# Patient Record
Sex: Male | Born: 1980 | Race: Black or African American | Hispanic: No | Marital: Single | Smoking: Never smoker
Health system: Southern US, Community
[De-identification: ages and names within clinical notes are randomized; demographics above are authoritative.]

## PROBLEM LIST (undated history)

## (undated) DIAGNOSIS — N390 Urinary tract infection, site not specified: Secondary | ICD-10-CM

## (undated) DIAGNOSIS — G8220 Paraplegia, unspecified: Secondary | ICD-10-CM

## (undated) DIAGNOSIS — S24103A Unspecified injury at T7-T10 level of thoracic spinal cord, initial encounter: Secondary | ICD-10-CM

## (undated) DIAGNOSIS — J45909 Unspecified asthma, uncomplicated: Secondary | ICD-10-CM

## (undated) DIAGNOSIS — W3400XA Accidental discharge from unspecified firearms or gun, initial encounter: Secondary | ICD-10-CM

## (undated) HISTORY — PX: SKIN GRAFT: SHX250

---

## 2013-01-24 ENCOUNTER — Encounter (HOSPITAL_BASED_OUTPATIENT_CLINIC_OR_DEPARTMENT_OTHER): Payer: Self-pay

## 2013-01-24 ENCOUNTER — Emergency Department (HOSPITAL_BASED_OUTPATIENT_CLINIC_OR_DEPARTMENT_OTHER)
Admission: EM | Admit: 2013-01-24 | Discharge: 2013-01-24 | Disposition: A | Discharge status: Home / Self Care | Payer: Medicaid Other | Attending: Emergency Medicine | Admitting: Emergency Medicine

## 2013-01-24 DIAGNOSIS — N390 Urinary tract infection, site not specified: Secondary | ICD-10-CM | POA: Insufficient documentation

## 2013-01-24 DIAGNOSIS — Z87828 Personal history of other (healed) physical injury and trauma: Secondary | ICD-10-CM | POA: Insufficient documentation

## 2013-01-24 DIAGNOSIS — Z8781 Personal history of (healed) traumatic fracture: Secondary | ICD-10-CM | POA: Insufficient documentation

## 2013-01-24 DIAGNOSIS — R6883 Chills (without fever): Secondary | ICD-10-CM | POA: Insufficient documentation

## 2013-01-24 HISTORY — DX: Unspecified asthma, uncomplicated: J45.909

## 2013-01-24 HISTORY — DX: Unspecified injury at T7-t10 level of thoracic spinal cord, initial encounter: S24.103A

## 2013-01-24 LAB — URINALYSIS, ROUTINE W REFLEX MICROSCOPIC
Bilirubin Urine: NEGATIVE
Glucose, UA: NEGATIVE mg/dL
Ketones, ur: NEGATIVE mg/dL
Protein, ur: NEGATIVE mg/dL
Urobilinogen, UA: 1 mg/dL (ref 0.0–1.0)

## 2013-01-24 LAB — URINE MICROSCOPIC-ADD ON

## 2013-01-24 MED ORDER — CIPROFLOXACIN HCL 500 MG PO TABS: 500.0000 mg | ORAL_TABLET | Freq: Two times a day (BID) | ORAL | Status: DC

## 2013-01-24 NOTE — ED Provider Notes (Signed)
History/physical exam/procedure(s) were performed by non-physician practitioner and as supervising physician I was immediately available for consultation/collaboration. I have reviewed all notes and am in agreement with care and plan.   Milderd Manocchio S Amaru Burroughs, MD 01/24/13 2308 

## 2013-01-24 NOTE — ED Provider Notes (Signed)
History     CSN: [id number]  Arrival date & time 01/24/13  1558   First MD Initiated Contact with Patient 01/24/13 1612      Chief Complaint  Patient presents with  . Urinary Tract Infection    (Consider location/radiation/quality/duration/timing/severity/associated sxs/prior treatment) HPI Comments: Patient is a 32 year old male with a past medical history of spinal cord injury who presents with dysuria. Patient reports symptoms started 3 days ago and have continued since the onset. Patient reports associated chills, dark urine, and leaking urine between cath times. Patient has not tried anything for symptoms. No aggravating/alleviating factors.    Past Medical History  Diagnosis Date  . T7 spinal cord injury   . Asthma     Past Surgical History  Procedure Laterality Date  . Skin graft      History reviewed. No pertinent family history.  History  Substance Use Topics  . Smoking status: Never Smoker   . Smokeless tobacco: Never Used  . Alcohol Use: No      Review of Systems  Genitourinary: Positive for dysuria.  All other systems reviewed and are negative.    Allergies  Sulfa antibiotics  Home Medications   Current Outpatient Rx  Name  Route  Sig  Dispense  Refill  . oxybutynin (DITROPAN XL) 15 MG 24 hr tablet   Oral   Take 15 mg by mouth 2 (two) times daily.         . traMADol (ULTRAM) 50 MG tablet   Oral   Take 50 mg by mouth 2 (two) times daily.           BP 122/72  Pulse 94  Temp(Src) 98.4 F (36.9 C) (Oral)  Ht 5\' 8"  (1.727 m)  Wt 125 lb (56.7 kg)  BMI 19.01 kg/m2  SpO2 96%  Physical Exam  Nursing note and vitals reviewed. Constitutional: He appears well-developed and well-nourished. No distress.  HENT:  Head: Normocephalic and atraumatic.  Eyes: Conjunctivae are normal.  Neck: Normal range of motion.  Cardiovascular: Normal rate and regular rhythm.  Exam reveals no gallop and no friction rub.   No murmur  heard. Pulmonary/Chest: Effort normal and breath sounds normal. He has no wheezes. He has no rales. He exhibits no tenderness.  Abdominal: Soft. There is no tenderness.  Musculoskeletal: Normal range of motion.  Neurological: He is alert.  Speech is goal-oriented. Moves limbs without ataxia.   Skin: Skin is warm and dry.  Psychiatric: He has a normal mood and affect. His behavior is normal.    ED Course  Procedures (including critical care time)  Labs Reviewed  URINALYSIS, ROUTINE W REFLEX MICROSCOPIC - Abnormal; Notable for the following:    APPearance CLOUDY (*)    Hgb urine dipstick TRACE (*)    Nitrite POSITIVE (*)    Leukocytes, UA TRACE (*)    All other components within normal limits  URINE MICROSCOPIC-ADD ON - Abnormal; Notable for the following:    Bacteria, UA MANY (*)    All other components within normal limits  URINE CULTURE   No results found.   1. UTI (urinary tract infection)       MDM  5:39 PM Urinalysis consistent with UTI. Patient will be treated accordingly. No further evaluation needed at this time. Patient will be discharged.         Emilia Beck, PA-C 01/24/13 1750

## 2013-01-24 NOTE — ED Notes (Signed)
Kaitlyn, PAC at bedside.
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
## 2013-01-26 ENCOUNTER — Telehealth (HOSPITAL_COMMUNITY): Payer: Self-pay | Admitting: Emergency Medicine

## 2013-01-26 LAB — URINE CULTURE: Colony Count: >=100000

## 2013-01-26 NOTE — ED Notes (Signed)
Prescription(s) not signed.  Pharmacy calling to verify prescription for Cipro.

## 2013-01-27 ENCOUNTER — Telehealth (HOSPITAL_COMMUNITY): Payer: Self-pay | Admitting: Emergency Medicine

## 2013-01-27 NOTE — ED Notes (Signed)
+  Urine. Patient given Cipro. Resistant. Chart sent to EDP office for review. °

## 2013-01-27 NOTE — ED Notes (Signed)
Patient has +Urine culture. Checking to see if appropriately treated. °

## 2013-01-28 ENCOUNTER — Telehealth (HOSPITAL_COMMUNITY): Payer: Self-pay | Admitting: Emergency Medicine

## 2013-01-28 NOTE — ED Notes (Signed)
Spoke with pt, verified ID. Informed of lab results. Roxy Horseman reviewed chart and ordered Macrobid 100 mg po bid x 7 days. No refills. meds called to [facility] [phone].

## 2014-06-23 ENCOUNTER — Encounter (HOSPITAL_BASED_OUTPATIENT_CLINIC_OR_DEPARTMENT_OTHER): Payer: Self-pay | Admitting: Emergency Medicine

## 2014-06-23 ENCOUNTER — Emergency Department (HOSPITAL_BASED_OUTPATIENT_CLINIC_OR_DEPARTMENT_OTHER)
Admission: EM | Admit: 2014-06-23 | Discharge: 2014-06-24 | Disposition: A | Discharge status: Home / Self Care | Payer: Medicaid Other | Attending: Emergency Medicine | Admitting: Emergency Medicine

## 2014-06-23 DIAGNOSIS — Z79899 Other long term (current) drug therapy: Secondary | ICD-10-CM | POA: Diagnosis not present

## 2014-06-23 DIAGNOSIS — N390 Urinary tract infection, site not specified: Secondary | ICD-10-CM | POA: Diagnosis not present

## 2014-06-23 DIAGNOSIS — R509 Fever, unspecified: Secondary | ICD-10-CM | POA: Diagnosis not present

## 2014-06-23 DIAGNOSIS — J45909 Unspecified asthma, uncomplicated: Secondary | ICD-10-CM | POA: Diagnosis not present

## 2014-06-23 DIAGNOSIS — R51 Headache: Secondary | ICD-10-CM | POA: Diagnosis not present

## 2014-06-23 DIAGNOSIS — Z87828 Personal history of other (healed) physical injury and trauma: Secondary | ICD-10-CM | POA: Diagnosis not present

## 2014-06-23 DIAGNOSIS — R34 Anuria and oliguria: Secondary | ICD-10-CM | POA: Insufficient documentation

## 2014-06-23 DIAGNOSIS — Z792 Long term (current) use of antibiotics: Secondary | ICD-10-CM | POA: Insufficient documentation

## 2014-06-23 LAB — URINALYSIS, ROUTINE W REFLEX MICROSCOPIC
BILIRUBIN URINE: NEGATIVE
Glucose, UA: NEGATIVE mg/dL
Ketones, ur: NEGATIVE mg/dL
NITRITE: POSITIVE — AB
PROTEIN: NEGATIVE mg/dL
SPECIFIC GRAVITY, URINE: 1.029 (ref 1.005–1.030)
UROBILINOGEN UA: 0.2 mg/dL (ref 0.0–1.0)
pH: 6 (ref 5.0–8.0)

## 2014-06-23 LAB — URINE MICROSCOPIC-ADD ON

## 2014-06-23 MED ORDER — NITROFURANTOIN MONOHYD MACRO 100 MG PO CAPS: 100.0000 mg | ORAL_CAPSULE | Freq: Two times a day (BID) | ORAL | Status: DC

## 2014-06-23 MED ORDER — LIDOCAINE HCL (PF) 1 % IJ SOLN
INTRAMUSCULAR | Status: AC
  Administered 2014-06-23: 2 mL
  Filled 2014-06-23: qty 5

## 2014-06-23 MED ORDER — IBUPROFEN 800 MG PO TABS
800.0000 mg | ORAL_TABLET | Freq: Once | ORAL | Status: AC
  Administered 2014-06-23: 800 mg via ORAL
  Filled 2014-06-23: qty 1

## 2014-06-23 MED ORDER — CEFTRIAXONE SODIUM 1 G IJ SOLR
1.0000 g | Freq: Once | INTRAMUSCULAR | Status: AC
  Administered 2014-06-23: 1 g via INTRAMUSCULAR
  Filled 2014-06-23: qty 10

## 2014-06-23 NOTE — ED Provider Notes (Signed)
CSN: [id number]     Arrival date & time 06/23/14  2254 History  This chart was scribed for Edison Wollschlager Smitty Cords, MD by Mark Simmons, ED Scribe. This patient was seen in room MH06/MH06 and the patient's care was started at 11:13 PM.      Chief Complaint  Patient presents with  . Headache  . Fever  . Urinary Tract Infection   Patient is a 33 y.o. male presenting with fever and urinary tract infection. The history is provided by the patient. No language interpreter was used.  Fever Temp source:  Subjective Severity:  Moderate Onset quality:  Gradual Duration:  1 day Timing:  Sporadic Progression:  Improving Chronicity:  Recurrent Relieved by:  Nothing Worsened by:  Nothing tried Ineffective treatments:  None tried Associated symptoms: no chest pain, no chills, no cough, no nausea, no rash, no rhinorrhea and no vomiting   Associated symptoms comment:  Dark strong urine.   Risk factors: no recent travel   Urinary Tract Infection This is a recurrent problem. The current episode started more than 2 days ago. The problem occurs constantly. The problem has not changed since onset.Pertinent negatives include no chest pain, no abdominal pain and no shortness of breath. Nothing aggravates the symptoms. Nothing relieves the symptoms. He has tried nothing for the symptoms. The treatment provided no relief.   HPI Comments: Mark Simmons is a 33 y.o. male who presents to the Emergency Department complaining of UTI symptoms onset 3 days prior. He states he has associated dark urine, decreased urine, funny smelling urine and subjective fever, and mild headache. 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 He states his last UTI was 8 months prior. He denies pressure, hematuria, back pain, vomiting, diarrhea.  He states these are the symptoms he usually gets with his UTIs.    Past Medical History  Diagnosis Date  . T7 spinal cord injury   . Asthma    Past  Surgical History  Procedure Laterality Date  . Skin graft     History reviewed. No pertinent family history. History  Substance Use Topics  . Smoking status: Never Smoker   . Smokeless tobacco: Never Used  . Alcohol Use: No    Review of Systems  Constitutional: Positive for fever. Negative for chills.  HENT: Negative for rhinorrhea.   Respiratory: Negative for cough and shortness of breath.   Cardiovascular: Negative for chest pain.  Gastrointestinal: Negative for nausea, vomiting and abdominal pain.  Genitourinary: Positive for decreased urine volume. Negative for hematuria.  Musculoskeletal: Negative for neck pain and neck stiffness.  Skin: Negative for rash.  All other systems reviewed and are negative.   Allergies  Sulfa antibiotics  Home Medications   Prior to Admission medications   Medication Sig Start Date End Date Taking? Authorizing Provider  ciprofloxacin (CIPRO) 500 MG tablet Take 1 tablet (500 mg total) by mouth 2 (two) times daily. 01/24/13   Kaitlyn Szekalski, PA-C  oxybutynin (DITROPAN XL) 15 MG 24 hr tablet Take 15 mg by mouth 2 (two) times daily.    Historical Provider, MD  traMADol (ULTRAM) 50 MG tablet Take 50 mg by mouth 2 (two) times daily.    Historical Provider, MD   Triage Vitals: BP 113/79  Pulse 80  Temp(Src) 98.7 F (37.1 C) (Oral)  Resp 16  Ht 5\' 7"  (1.702 m)  Wt 115 lb (52.164 kg)  BMI 18.01 kg/m2  SpO2 100%  Physical Exam  Nursing note and vitals reviewed. Constitutional: He is oriented to person, place, and time. He appears well-developed and well-nourished. No distress.  Well appearing, resting comfortably in the room with lights on  HENT:  Head: Normocephalic and atraumatic.  Mouth/Throat: Oropharynx is clear and moist.  Eyes: Conjunctivae and EOM are normal. Pupils are equal, round, and reactive to light.  Neck: Normal range of motion. Neck supple. No tracheal deviation present.  Cardiovascular: Normal rate, regular rhythm and  normal heart sounds.   Pulmonary/Chest: Effort normal and breath sounds normal. No respiratory distress. He has no wheezes. He has no rales. He exhibits no tenderness.  Abdominal: Soft. Bowel sounds are normal. There is no tenderness. There is no rebound and no guarding.  Musculoskeletal: Normal range of motion.  Neurological: He is alert and oriented to person, place, and time.  Skin: Skin is warm and dry.  Psychiatric: He has a normal mood and affect. His behavior is normal.    ED Course  Procedures (including critical care time) DIAGNOSTIC STUDIES: Oxygen Saturation is 100% on RA, normal by my interpretation.    COORDINATION OF CARE: 11:41 PM-Discussed treatment plan which includes IM shot of Rocephin and 7 day prescription of Macrobid with pt at bedside and pt agreed to plan.     Labs Review Labs Reviewed - No data to display  Imaging Review No results found.   EKG Interpretation None      MDM   Final diagnoses:  None   Medications  cefTRIAXone (ROCEPHIN) injection 1 g (1 g Intramuscular Given 06/23/14 2357)  ibuprofen (ADVIL,MOTRIN) tablet 800 mg (800 mg Oral Given 06/23/14 2357)  lidocaine (PF) (XYLOCAINE) 1 % injection (2 mLs  Given 06/23/14 2357)    MDM Reviewed: previous chart, nursing note and vitals Reviewed previous: labs (culture and sensitivities of previous UTI reviewed for antibiotic choice) Interpretation: labs (has UTI)    Will treat with macrobid.  Return for vomiting, weakness, fevers, > 101,  Inability to tolerate medication or liquids or any concerns.  Follow up with your regular doctor in 7 days for recheck.  Patient verbalizes understanding  I personally performed the services described in this documentation, which was scribed in my presence. The recorded information has been reviewed and is accurate.       Rozelle Caudle Smitty CordsK Kylar Speelman-Rasch, MD 06/24/14 0230
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
## 2014-06-23 NOTE — ED Notes (Signed)
Patient reports that he self caths at home - also upon arrival went to snack machines prior to getting triaged.

## 2014-06-24 ENCOUNTER — Encounter (HOSPITAL_BASED_OUTPATIENT_CLINIC_OR_DEPARTMENT_OTHER): Payer: Self-pay | Admitting: Emergency Medicine

## 2015-11-17 ENCOUNTER — Encounter (HOSPITAL_BASED_OUTPATIENT_CLINIC_OR_DEPARTMENT_OTHER): Payer: Self-pay | Admitting: *Deleted

## 2015-11-17 ENCOUNTER — Emergency Department (HOSPITAL_BASED_OUTPATIENT_CLINIC_OR_DEPARTMENT_OTHER)
Admission: EM | Admit: 2015-11-17 | Discharge: 2015-11-17 | Disposition: A | Discharge status: Home / Self Care | Payer: Medicaid Other | Attending: Emergency Medicine | Admitting: Emergency Medicine

## 2015-11-17 DIAGNOSIS — K029 Dental caries, unspecified: Secondary | ICD-10-CM | POA: Diagnosis not present

## 2015-11-17 DIAGNOSIS — K0889 Other specified disorders of teeth and supporting structures: Secondary | ICD-10-CM | POA: Diagnosis not present

## 2015-11-17 DIAGNOSIS — Z87828 Personal history of other (healed) physical injury and trauma: Secondary | ICD-10-CM | POA: Diagnosis not present

## 2015-11-17 DIAGNOSIS — J45909 Unspecified asthma, uncomplicated: Secondary | ICD-10-CM | POA: Insufficient documentation

## 2015-11-17 DIAGNOSIS — Z79899 Other long term (current) drug therapy: Secondary | ICD-10-CM | POA: Insufficient documentation

## 2015-11-17 DIAGNOSIS — K0381 Cracked tooth: Secondary | ICD-10-CM | POA: Insufficient documentation

## 2015-11-17 MED ORDER — NAPROXEN 500 MG PO TABS: 500.0000 mg | ORAL_TABLET | Freq: Two times a day (BID) | ORAL | Status: DC

## 2015-11-17 MED ORDER — PENICILLIN V POTASSIUM 500 MG PO TABS: 500.0000 mg | ORAL_TABLET | Freq: Four times a day (QID) | ORAL | Status: DC

## 2015-11-17 NOTE — ED Notes (Signed)
Per pt report tooth pain to lt lower with broken tooth - tried to see density today. Increase pain.

## 2015-11-17 NOTE — ED Provider Notes (Signed)
CSN: [id number]     Arrival date & time 11/17/15  1447 History   First MD Initiated Contact with Patient 11/17/15 1455     Chief Complaint  Patient presents with  . Dental Pain     (Consider location/radiation/quality/duration/timing/severity/associated sxs/prior Treatment) Patient is a 33 y.o. male presenting with tooth pain. The history is provided by the patient and medical records.  Dental Pain    34 y.o. M with hx of SCI at T7, asthma, presenting to the ED for left lower dental pain. Patient states he broke his left lower molar several months ago and has had intermittent issues since this time. He states today upon waking, pain increased. Pain worse with chewing on affected side.  He tried to go to 2 different walk-in dental clinics, however both were closed. He does not currently have a dentist that he is established with. He denies any fever or chills. No facial swelling or difficulty swallowing.  Past Medical History  Diagnosis Date  . T7 spinal cord injury (HCC)   . Asthma    Past Surgical History  Procedure Laterality Date  . Skin graft     History reviewed. No pertinent family history. Social History  Substance Use Topics  . Smoking status: Never Smoker   . Smokeless tobacco: Never Used  . Alcohol Use: No    Review of Systems  HENT: Positive for dental problem.   All other systems reviewed and are negative.     Allergies  Sulfa antibiotics  Home Medications   Prior to Admission medications   Medication Sig Start Date End Date Taking? Authorizing Provider  oxybutynin (DITROPAN XL) 15 MG 24 hr tablet Take 15 mg by mouth 2 (two) times daily.   Yes Historical Provider, MD   BP 124/93 mmHg  Pulse 95  Temp(Src) 97.8 F (36.6 C) (Oral)  Resp 20  Ht  (1.702 m)  Wt 49.896 kg  BMI 17.22 kg/m2  SpO2 100%   Physical Exam  Constitutional: He is oriented to person, place, and time. He appears well-developed and well-nourished. No distress.  HENT:   Head: Normocephalic and atraumatic.  Mouth/Throat: Oropharynx is clear and moist.  Teeth largely in fair dentition, left lower molars with large cavities noted nearly spanning entire upper surface of tooth, surrounding gingiva with slight erythema and swelling, handling secretions appropriately, no trismus; no facial or neck swelling, normal phonation without stridor  Eyes: Conjunctivae and EOM are normal. Pupils are equal, round, and reactive to light.  Neck: Normal range of motion. Neck supple.  Cardiovascular: Normal rate, regular rhythm and normal heart sounds.   Pulmonary/Chest: Effort normal and breath sounds normal. No respiratory distress. He has no wheezes.  Abdominal: Soft.  Musculoskeletal: Normal range of motion.  Neurological: He is alert and oriented to person, place, and time.  Skin: Skin is warm and dry. He is not diaphoretic.  Psychiatric: He has a normal mood and affect.  Nursing note and vitals reviewed.   ED Course  Procedures (including critical care time) Labs Review Labs Reviewed - No data to display  Imaging Review No results found. I have personally reviewed and evaluated these images and lab results as part of my medical decision-making.   EKG Interpretation None      MDM   Final diagnoses:  Pain, dental   There is external male here for left lower dental pain. To this broken with large cavities noted. Slight swelling and erythema of surrounding gingiva without definitive abscess. No  facial or neck swelling to suggest blood with angina. Patient is handling his secretions well. Will start on penicillin and Naprosyn. Patient was given dentist follow-up with on-call dentist, also given resource guide.  Discussed plan with patient, he/she acknowledged understanding and agreed with plan of care.  Return precautions given for new or worsening symptoms.  Garlon HatchetLisa M Aleka Twitty, PA-C 11/17/15 1541  Geoffery Lyonsouglas Delo, MD 11/18/15 [phone]

## 2015-11-17 NOTE — Discharge Instructions (Signed)
Take the prescribed medication as directed. Follow-up with dentist as soon as possible-- may follow-up with dentist on call or you may be seen at the walk in clinic if you prefer. Return to the ED for new or worsening symptoms.   Emergency Department Resource Guide 1) Find a Doctor and Pay Out of Pocket Although you won't have to find out who is covered by your insurance plan, it is a good idea to ask around and get recommendations. You will then need to call the office and see if the doctor you have chosen will accept you as a new patient and what types of options they offer for patients who are self-pay. Some doctors offer discounts or will set up payment plans for their patients who do not have insurance, but you will need to ask so you aren't surprised when you get to your appointment.  2) Contact Your Local Health Department Not all health departments have doctors that can see patients for sick visits, but many do, so it is worth a call to see if yours does. If you don't know where your local health department is, you can check in your phone book. The CDC also has a tool to help you locate your state's health department, and many state websites also have listings of all of their local health departments.  3) Find a Walk-in Clinic If your illness is not likely to be very severe or complicated, you may want to try a walk in clinic. These are popping up all over the country in pharmacies, drugstores, and shopping centers. They're usually staffed by nurse practitioners or physician assistants that have been trained to treat common illnesses and complaints. They're usually fairly quick and inexpensive. However, if you have serious medical issues or chronic medical problems, these are probably not your best option.  No Primary Care Doctor: - Call Health Connect at  [phone] - they can help you locate a primary care doctor that  accepts your insurance, provides certain services, etc. - Physician  Referral Service- [phone]  Chronic Pain Problems: Organization         Address  Phone   Notes  [facility]  [phone] Patients need to be referred by their primary care doctor.   Medication Assistance: Organization         Address  Phone   Notes  [facility] [address] [place], [place] [address] [phone] --Must be a resident of [facility] -- Must have NO insurance coverage whatsoever (no Medicaid/ Medicare, etc.) -- The pt. MUST have a primary care doctor that directs their care regularly and follows them in the community   MedAssist  [phone]   [employer]  [phone]    Agencies that provide inexpensive medical care: Organization         Address  Phone   Notes  [facility]  [phone]   [facility]    [phone]   [facility] [address] [place], [place] [address] [phone]   [facility] [address], [place] [phone]   Planned Parenthood    [phone]   [facility]    [phone]   [facility] and [facility]  [address], [place] Phone:  [phone], Fax:  [phone] Hours of Operation:  9 am - 6 pm, M-F.  Also accepts Medicaid/Medicare  and self-pay.  [facility] for [place], [address], [place] Phone: [phone], Fax: [phone]. Hours of Operation:  8:30 am - 5:30 pm, M-F.  Also accepts Medicaid and self-pay.  [facility] [place] [address] [place] Phone: [phone]   [place], [place], [place] [phone], Ext. [phone] Mondays & Thursdays: 7-9 AM.  First 15 patients are seen on a first come, first serve basis.    [place] Providers:  Organization         Address  Phone   Notes  [facility] [address],  [phone] Also accepts self-pay patients.  [facility] [address], [place]  [phone]   [place], [address], [place] [phone]   [facility] [address], [place] [phone]   Lucianne Lei [address], [place]   [phone] Only accepts [place] Access [place] patients after they have their name applied to their card.   Self-Pay (no insurance) in [facility]:  Organization         Address  Phone   Notes  Sickle Cell Patients, [facility] [place] [phone]   [facility] [place] [phone]   [facility] [place]  [place], [place],  [phone]   [facility]/Dr. Osei-Bonsu  [address], [place] or [address], [place] [phone] Phone number for both [place] and [place] locations is the same.  Urgent Medical and [facility] [address], [place] [phone]   [facility] [address] [place] or [address] [phone]   [facility] [address], [place] [phone], phone; [phone], fax Sees patients 1st and 3rd Saturday of every month.  Must not qualify for public or private insurance (i.e. Medicaid, Medicare, [place] Health Choice, Veterans' Benefits)  Household income should be no more than 200% of the poverty level The clinic cannot treat you if you are pregnant or think you are pregnant  Sexually transmitted diseases are not treated at the clinic.    Dental Care: Organization         Address  Phone  Notes  [facility] Department of [facility] [place] [phone] Accepts children up to age 51 who are enrolled  in [place] or [place]; pregnant women with a Medicaid card; and children who have applied for Medicaid or [place] Health Choice, but were declined, whose parents can pay a reduced fee at time of service.  [facility] Department of [facility]  [address], [place] [phone] Accepts children up to age 68 who are enrolled in [place] or [place]; pregnant women with a Medicaid card; and children who have applied for Medicaid or  Health Choice, but were declined, whose parents can pay a reduced fee at time of service.  [facility]  [place] [phone] Patients are seen by appointment only. Walk-ins are not accepted. [place] will see patients 22 years of age and older. Monday - Tuesday (8am-5pm) Most Wednesdays (  8:30-5pm) $30 per visit, cash only  [facility] Access PROGRAM  [address], [facility] [phone] Patients are seen by appointment only. Walk-ins are not accepted. [place] will see patients 65 years of age and older. One Wednesday Evening (Monthly: Volunteer Based).  $30 per visit, cash only  [place]  [phone] for adults; Children under age 65, call Graduate Pediatric Dentistry at [phone]. Children aged 30-14, please call [phone] to request a pediatric application.  Dental services are provided in all areas of dental care including fillings, crowns and bridges, complete and partial dentures, implants, gum treatment, root canals, and extractions. Preventive care is also provided. Treatment is provided to both adults and children. Patients are selected via a lottery and there is often a waiting list.   [facility] [address], [place]  [phone] [email or web address]   [facility] [address] [place], [place] [phone], Ext. [phone] Second and Fourth Thursday of each month, opens at  6:30 AM; Clinic ends at 9 AM.  Patients are seen on a first-come first-served basis, and a limited number are seen during each clinic.   [facility]  [address] Hillard Danker [place], [place] [phone]   Eligibility Requirements You must have lived in [place], [place], or [place] counties for at least the last three months.   You cannot be eligible for state or federal sponsored [employer], including [employer], [place], or [employer].   You generally cannot be eligible for healthcare insurance through your employer.    How to apply: Eligibility screenings are held every Tuesday and Wednesday afternoon from 1:00 pm until 4:00 pm. You do not need an appointment for the interview!  [facility] [address] [place], [place]   [place]  [place] Department  [place]  [phone]    Behavioral Health Resources in the Community: Intensive Outpatient Programs Organization         Address  Phone  Notes  [place]. [address] [place], [place] [phone]   [facility] [address] [place], [place]   ADS: Alcohol & Drug Svcs [address], [place], [place]   [place] [address],  [place], [place] or [phone]   Substance Abuse Resources Organization         Address  Phone  Notes  Alcohol and Drug Services  [phone]   [place]  [phone]   The [place]   Chinita Pester  [phone]   Residential & Outpatient Substance Abuse Program  [phone]   Psychological Services Organization         Address  Phone  Notes  [facility]  [place]  [phone]   [place] [address] [place] or [phone]    Mobile Crisis Teams Organization         Address  Phone  Notes  [facility], Mobile Crisis Care Unit  [phone]   [facility]  [address] [place], [place]   [place] Levels [address] [place] [phone]    Self-Help/Support Groups Organization         Address  Phone  Notes  [facility]oc[facility] of [place] - variety of support groups  [place] Call for more information  Narcotics Anonymous (NA), Caring [facility] [address], [employer] [place]  2 meetings at this location   Special educational needs teacher         Address  Phone  Notes  ASAP Residential Treatment [place],    [place]  [phone]   [facility]  [address], [place] [phone], [place], [place]   [place], [place] [phone] Admissions: 8am-3pm M-F  Incentives Substance [place] [address],    [place], [place] [phone]   [facility] [address] [place], [place], [place]   [facility] [address],  [place], [place]   Insight Programs - Intensive Outpatient [address] Kristeen Mans [address], [place], [place]   [facility] ([place].) [address].,  [place], [place] [phone] or [phone]   [facility] Services (RTS) [address] [place], [place] Accepts Medicaid  [place] [address]  [place] [phone] Substance Abuse/Addiction Treatment   [facility] Organization         Address  Phone  Notes  [facility]  [phone]   Domenic Schwab, PhD [address] Arlis Porta [place], [place]   [phone] or [phone]   [place], [place] [phone]   [facility]  [address] [place], [place] [phone] Insurance/Medicaid/sponsorship through [facility] and Families [address], [address] [place]                                    [place], [place] [phone] [place] [address], [place] [phone]    Dr. Adele Schilder  [phone]   [facility] Dept. 1) [address], [place] 2) [place] 3)  [place] 65, [place] [phone]  [phone]   [place] [phone] or [phone] (After Hours)

## 2016-07-12 ENCOUNTER — Encounter (HOSPITAL_BASED_OUTPATIENT_CLINIC_OR_DEPARTMENT_OTHER): Payer: Self-pay

## 2016-07-12 ENCOUNTER — Emergency Department (HOSPITAL_BASED_OUTPATIENT_CLINIC_OR_DEPARTMENT_OTHER)
Admission: EM | Admit: 2016-07-12 | Discharge: 2016-07-12 | Disposition: A | Discharge status: Home / Self Care | Payer: Medicaid Other | Attending: Emergency Medicine | Admitting: Emergency Medicine

## 2016-07-12 DIAGNOSIS — R300 Dysuria: Secondary | ICD-10-CM | POA: Diagnosis present

## 2016-07-12 DIAGNOSIS — J45909 Unspecified asthma, uncomplicated: Secondary | ICD-10-CM | POA: Diagnosis not present

## 2016-07-12 DIAGNOSIS — N390 Urinary tract infection, site not specified: Secondary | ICD-10-CM | POA: Diagnosis not present

## 2016-07-12 LAB — URINALYSIS, ROUTINE W REFLEX MICROSCOPIC
Bilirubin Urine: NEGATIVE
Glucose, UA: NEGATIVE mg/dL
Hgb urine dipstick: NEGATIVE
KETONES UR: NEGATIVE mg/dL
NITRITE: POSITIVE — AB
PH: 7 (ref 5.0–8.0)
PROTEIN: NEGATIVE mg/dL
Specific Gravity, Urine: 1.017 (ref 1.005–1.030)

## 2016-07-12 LAB — URINE MICROSCOPIC-ADD ON

## 2016-07-12 MED ORDER — NITROFURANTOIN MONOHYD MACRO 100 MG PO CAPS: 100.0000 mg | ORAL_CAPSULE | Freq: Two times a day (BID) | ORAL | 0 refills | Status: DC

## 2016-07-12 MED ORDER — NITROFURANTOIN MONOHYD MACRO 100 MG PO CAPS
100.0000 mg | ORAL_CAPSULE | Freq: Once | ORAL | Status: AC
  Administered 2016-07-12: 100 mg via ORAL
  Filled 2016-07-12: qty 1

## 2016-07-12 NOTE — Discharge Instructions (Signed)
We will call you if your urine culture grows out bacteria that is not sensitive to the current antibiotic that you're prescribed.  Please follow-up with your primary care doctor to make sure your symptoms are improving.  Return without fail for worsening symptoms, including fever, intractable vomiting, abdominal or back pain, or any other symptoms concerning to you.

## 2016-07-12 NOTE — ED Triage Notes (Signed)
Pt c/o more frequent urination, darker urine and an odor for the last few days.  He self caths and thinks he has a UTI, denies fevers

## 2016-07-12 NOTE — ED Provider Notes (Signed)
M[facility]-EMERGENCY DEPT [facility] Provider Note   CSN: [id number] Arrival date & time: 07/12/16  0307  First Provider Contact:  None       History   Chief Complaint Chief Complaint  Patient presents with  . Dysuria    HPI Mark Simmons is a 35 y.o. male.  HPI 35 year old male who presents with concern for urinary tract infection.  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  For the past 2 days he has noticed that he has dark malodorous urine with intermittent leakage of urine between self-catheterization.  C6 is consistent with prior urinary tract infections that he has had in the past.  He denies any associated fever, chills, night sweats, abdominal pain, flank pain, nausea or vomiting.  States history of UTIs last several months ago for which she was seen for by his primary care doctor.  States that he was initially on ciprofloxacin, but his primary care doctor switched him to [facility] based on culture results.  This subsequently resolved his infection. Past Medical History:  Diagnosis Date  . Asthma   . T7 spinal cord injury (HCC)     There are no active problems to display for this patient.   Past Surgical History:  Procedure Laterality Date  . SKIN GRAFT         Home Medications    Prior to Admission medications   Medication Sig Start Date End Date Taking? Authorizing Provider  naproxen (NAPROSYN) 500 MG tablet Take 1 tablet (500 mg total) by mouth 2 (two) times daily with a meal. 11/17/15   Garlon HatchetLisa M Sanders, PA-C  oxybutynin (DITROPAN XL) 15 MG 24 hr tablet Take 15 mg by mouth 2 (two) times daily.    Historical Provider, MD  penicillin v potassium (VEETID) 500 MG tablet Take 1 tablet (500 mg total) by mouth 4 (four) times daily. 11/17/15   Garlon HatchetLisa M Sanders, PA-C    Family History No family history on file.  Social History Social History  Substance Use Topics  . Smoking status: Never  Smoker  . Smokeless tobacco: Never Used  . Alcohol use No     Allergies   Sulfa antibiotics   Review of Systems Review of Systems 10/14 systems reviewed and are negative other than those stated in the HPI   Physical Exam Updated Vital Signs BP 118/82 (BP Location: Right Arm)   Pulse 84   Temp 98.3 F (36.8 C) (Oral)   Resp 18   Wt 115 lb (52.2 kg)   SpO2 100%   BMI 18.01 kg/m   Physical Exam Physical Exam  Nursing note and vitals reviewed. Constitutional: Well developed, thin, non-toxic, and in no acute distress Head: Normocephalic and atraumatic.  Mouth/Throat: Oropharynx is clear and moist.  Neck: Normal range of motion. Neck supple.  Cardiovascular: Normal rate and regular rhythm.   Pulmonary/Chest: Effort normal and breath sounds normal.  Abdominal: Soft. There is no tenderness. There is no rebound and no guarding. No CVA tenderness. Musculoskeletal: Normal range of motion.  Neurological: Alert, no facial droop, fluent speech, moves all extremities symmetrically Skin: Skin is warm and dry.  Psychiatric: Cooperative   ED Treatments / Results  Labs (all labs ordered are listed, but only abnormal results are displayed) Labs Reviewed  URINALYSIS, ROUTINE W REFLEX MICROSCOPIC (NOT AT [facility]) - Abnormal; Notable for the following:       Result Value   APPearance CLOUDY (*)    Nitrite  POSITIVE (*)    Leukocytes, UA MODERATE (*)    All other components within normal limits  URINE MICROSCOPIC-ADD ON - Abnormal; Notable for the following:    Squamous Epithelial / LPF 0-5 (*)    Bacteria, UA MANY (*)    All other components within normal limits  URINE CULTURE    EKG  EKG Interpretation None       Radiology No results found.  Procedures Procedures (including critical care time)  Medications Ordered in ED Medications  nitrofurantoin (macrocrystal-monohydrate) (MACROBID) capsule 100 mg (not administered)     Initial Impression / Assessment and Plan /  ED Course  I have reviewed the triage vital signs and the nursing notes.  Pertinent labs & imaging results that were available during my care of the patient were reviewed by me and considered in my medical decision making (see chart for details).  Clinical Course    Presenting with urinary tract infection.  Urine is nitrite and leukoesterase positive with WBCs and bacteria.  No abdominal pain or flank tenderness.  He is afebrile, hemodynamically stable, and without signs or symptoms of systemic illness.  Urine is sent for culture.  Past cultures are reviewed and he has grown Escherichia coli resistant to Bactrim, ciprofloxacin.  He has had sensitivity to Macrobid, and given successful treatment with macrobid in the past, he will be discharged with macrobid. Strict return and follow-up instructions reviewed. He expressed understanding of all discharge instructions and felt comfortable with the plan of care.    Final Clinical Impressions(s) / ED Diagnoses   Final diagnoses:  UTI (lower urinary tract infection)    New Prescriptions New Prescriptions   No medications on file     Lavera Guise, MD 07/12/16 [phone]

## 2016-07-12 NOTE — ED Notes (Signed)
Pt verbalizes understanding of d/c instructions and denies any further needs at this time. 

## 2016-07-13 LAB — URINE CULTURE

## 2016-08-05 ENCOUNTER — Encounter (HOSPITAL_BASED_OUTPATIENT_CLINIC_OR_DEPARTMENT_OTHER): Payer: Self-pay | Admitting: *Deleted

## 2016-08-05 DIAGNOSIS — J45909 Unspecified asthma, uncomplicated: Secondary | ICD-10-CM | POA: Insufficient documentation

## 2016-08-05 DIAGNOSIS — Z960 Presence of urogenital implants: Secondary | ICD-10-CM | POA: Diagnosis not present

## 2016-08-05 DIAGNOSIS — N390 Urinary tract infection, site not specified: Secondary | ICD-10-CM | POA: Diagnosis not present

## 2016-08-05 DIAGNOSIS — Z79899 Other long term (current) drug therapy: Secondary | ICD-10-CM | POA: Insufficient documentation

## 2016-08-05 DIAGNOSIS — R109 Unspecified abdominal pain: Secondary | ICD-10-CM | POA: Diagnosis present

## 2016-08-05 NOTE — ED Triage Notes (Signed)
Pt c/o abd pain and ? UTI , seen here x 3 weeks ago for same DX uti, pt caths self

## 2016-08-06 ENCOUNTER — Emergency Department (HOSPITAL_BASED_OUTPATIENT_CLINIC_OR_DEPARTMENT_OTHER)
Admission: EM | Admit: 2016-08-06 | Discharge: 2016-08-06 | Disposition: A | Discharge status: Home / Self Care | Payer: Medicaid Other | Attending: Emergency Medicine | Admitting: Emergency Medicine

## 2016-08-06 DIAGNOSIS — Z789 Other specified health status: Secondary | ICD-10-CM

## 2016-08-06 DIAGNOSIS — N390 Urinary tract infection, site not specified: Secondary | ICD-10-CM

## 2016-08-06 LAB — URINE MICROSCOPIC-ADD ON: RBC / HPF: NONE SEEN RBC/hpf (ref 0–5)

## 2016-08-06 LAB — URINALYSIS, ROUTINE W REFLEX MICROSCOPIC
Bilirubin Urine: NEGATIVE
GLUCOSE, UA: NEGATIVE mg/dL
Hgb urine dipstick: NEGATIVE
Ketones, ur: NEGATIVE mg/dL
NITRITE: POSITIVE — AB
PH: 6.5 (ref 5.0–8.0)
Protein, ur: NEGATIVE mg/dL
SPECIFIC GRAVITY, URINE: 1.025 (ref 1.005–1.030)

## 2016-08-06 MED ORDER — CIPROFLOXACIN HCL 500 MG PO TABS: 500.0000 mg | ORAL_TABLET | Freq: Two times a day (BID) | ORAL | 0 refills | Status: DC

## 2016-08-06 MED ORDER — CIPROFLOXACIN HCL 500 MG PO TABS
500.0000 mg | ORAL_TABLET | Freq: Once | ORAL | Status: AC
  Administered 2016-08-06: 500 mg via ORAL
  Filled 2016-08-06: qty 1

## 2016-08-06 NOTE — ED Provider Notes (Signed)
[facility]-EMERGENCY DEPT [facility] Provider Note   CSN: [id number] Arrival date & time: 08/05/16  2335  By signing my name below, I, Emmanuella Mensah, attest that this documentation has been prepared under the direction and in the presence of Paula LibraJohn Halli Equihua, MD. Electronically Signed: Angelene GiovanniEmmanuella Mensah, ED Scribe. 08/06/16. 1:00 AM.    History   Chief Complaint Chief Complaint  Patient presents with  . Abdominal Pain    HPI Comments: Mark Simmons is a 35 y.o. male with a hx of T7 spinal lesion as a result of an injury who presents to the Emergency Department complaining of a resolved episode of moderate right flank pain onset 2 days but subsequently resolved. He was seen on 07/12/16 with concerns for a UTI (dark, malodorous urine and leakage of urine) and was treated with Macrobid; urine culture grew out multiple species. He is here today because his symptoms are still persisting. He reports associated subjective fever and one episode of non-bloody vomiting. Pt self-catheterizes 5 times a day due to a neurogenic bladder. He denies any chills, nausea or diarrhea.   The history is provided by the patient. No language interpreter was used.    Past Medical History:  Diagnosis Date  . Asthma   . T7 spinal cord injury (HCC)     There are no active problems to display for this patient.   Past Surgical History:  Procedure Laterality Date  . SKIN GRAFT         Home Medications    Prior to Admission medications   Medication Sig Start Date End Date Taking? Authorizing Provider  oxybutynin (DITROPAN XL) 15 MG 24 hr tablet Take 15 mg by mouth 2 (two) times daily.    Historical Provider, MD    Family History History reviewed. No pertinent family history.  Social History Social History  Substance Use Topics  . Smoking status: Never Smoker  . Smokeless tobacco: Never Used  . Alcohol use No     Allergies   Sulfa antibiotics   Review of Systems Review of Systems    Constitutional: Positive for fever. Negative for chills.  Gastrointestinal: Positive for vomiting. Negative for diarrhea and nausea.  All other systems reviewed and are negative.    Physical Exam Updated Vital Signs BP 111/76   Pulse 87   Temp 98.3 F (36.8 C)   Resp 16   Ht 5\' 6"  (1.676 m)   Wt 115 lb (52.2 kg)   SpO2 98%   BMI 18.56 kg/m   Physical Exam General: Well-developed, cachectic male in no acute distress; appearance consistent with age of record HENT: normocephalic; atraumatic Eyes: pupils equal, round and reactive to light; extraocular muscles intact Neck: supple Heart: regular rate and rhythm Lungs: clear to auscultation bilaterally Abdomen: soft; nondistended; nontender; no masses or hepatosplenomegaly; bowel sounds present GU: No CVA tenderness Extremities: Atrophy of lower extremities Neurologic: Awake, alert and oriented; paraplegic; no facial droop Skin: Warm and dry Psychiatric: Normal mood and affect   ED Treatments / Results   Nursing notes and vitals signs, including pulse oximetry, reviewed.  Summary of this visit's results, reviewed by myself:  Labs:  Results for orders placed or performed during the hospital encounter of 08/06/16 (from the past 24 hour(s))  Urinalysis, Routine w reflex microscopic (not at [facility])     Status: Abnormal   Collection Time: 08/05/16 11:40 PM  Result Value Ref Range   Color, Urine YELLOW YELLOW   APPearance CLEAR CLEAR   Specific Gravity, Urine 1.025  1.005 - 1.030   pH 6.5 5.0 - 8.0   Glucose, UA NEGATIVE NEGATIVE mg/dL   Hgb urine dipstick NEGATIVE NEGATIVE   Bilirubin Urine NEGATIVE NEGATIVE   Ketones, ur NEGATIVE NEGATIVE mg/dL   Protein, ur NEGATIVE NEGATIVE mg/dL   Nitrite POSITIVE (A) NEGATIVE   Leukocytes, UA MODERATE (A) NEGATIVE  Urine microscopic-add on     Status: Abnormal   Collection Time: 08/05/16 11:40 PM  Result Value Ref Range   Squamous Epithelial / LPF 0-5 (A) NONE SEEN   WBC, UA 6-30 0  - 5 WBC/hpf   RBC / HPF NONE SEEN 0 - 5 RBC/hpf   Bacteria, UA MANY (A) NONE SEEN   Urine sent for culture. Will treat with Cipro as this represents a complicated UTI due to self-catheterization.  Procedures (including critical care time)   Final Clinical Impressions(s) / ED Diagnoses   Final diagnoses:  Self-catheterizes urinary bladder  Complicated UTI (urinary tract infection)   I personally performed the services described in this documentation, which was scribed in my presence. The recorded information has been reviewed and is accurate.    Paula Libra, MD 08/06/16 [phone]

## 2016-10-18 ENCOUNTER — Encounter (HOSPITAL_BASED_OUTPATIENT_CLINIC_OR_DEPARTMENT_OTHER): Payer: Self-pay

## 2016-10-18 ENCOUNTER — Emergency Department (HOSPITAL_BASED_OUTPATIENT_CLINIC_OR_DEPARTMENT_OTHER)
Admission: EM | Admit: 2016-10-18 | Discharge: 2016-10-18 | Disposition: A | Discharge status: Home / Self Care | Payer: Medicaid Other | Attending: Emergency Medicine | Admitting: Emergency Medicine

## 2016-10-18 DIAGNOSIS — Z791 Long term (current) use of non-steroidal anti-inflammatories (NSAID): Secondary | ICD-10-CM | POA: Diagnosis not present

## 2016-10-18 DIAGNOSIS — J45909 Unspecified asthma, uncomplicated: Secondary | ICD-10-CM | POA: Insufficient documentation

## 2016-10-18 DIAGNOSIS — R109 Unspecified abdominal pain: Secondary | ICD-10-CM | POA: Diagnosis present

## 2016-10-18 DIAGNOSIS — N390 Urinary tract infection, site not specified: Secondary | ICD-10-CM | POA: Insufficient documentation

## 2016-10-18 LAB — URINE MICROSCOPIC-ADD ON

## 2016-10-18 LAB — URINALYSIS, ROUTINE W REFLEX MICROSCOPIC
BILIRUBIN URINE: NEGATIVE
GLUCOSE, UA: NEGATIVE mg/dL
HGB URINE DIPSTICK: NEGATIVE
KETONES UR: NEGATIVE mg/dL
Nitrite: POSITIVE — AB
PROTEIN: NEGATIVE mg/dL
Specific Gravity, Urine: 1.025 (ref 1.005–1.030)
pH: 6 (ref 5.0–8.0)

## 2016-10-18 MED ORDER — NITROFURANTOIN MONOHYD MACRO 100 MG PO CAPS: 100.0000 mg | ORAL_CAPSULE | Freq: Two times a day (BID) | ORAL | 0 refills | Status: DC

## 2016-10-18 MED ORDER — KETOROLAC TROMETHAMINE 60 MG/2ML IM SOLN
60.0000 mg | Freq: Once | INTRAMUSCULAR | Status: AC
  Administered 2016-10-18: 60 mg via INTRAMUSCULAR
  Filled 2016-10-18: qty 2

## 2016-10-18 MED ORDER — NITROFURANTOIN MONOHYD MACRO 100 MG PO CAPS
100.0000 mg | ORAL_CAPSULE | Freq: Once | ORAL | Status: AC
  Administered 2016-10-18: 100 mg via ORAL
  Filled 2016-10-18: qty 1

## 2016-10-18 MED ORDER — PHENAZOPYRIDINE HCL 200 MG PO TABS: 200.0000 mg | ORAL_TABLET | Freq: Three times a day (TID) | ORAL | 0 refills | Status: DC

## 2016-10-18 NOTE — ED Provider Notes (Addendum)
[facility] Provider [facility]   CSN: [id number] Arrival date & time: 10/18/16  0212     History   Chief Complaint Chief Complaint  Patient presents with  . Flank Pain    HPI Mark Simmons is a 35 y.o. male.  The history is provided by the patient.  Flank Pain  This is a recurrent problem. The current episode started more than 2 days ago. The problem occurs constantly. The problem has not changed since onset.Pertinent negatives include no chest pain, no headaches and no shortness of breath. Nothing aggravates the symptoms. Nothing relieves the symptoms. He has tried nothing for the symptoms. The treatment provided no relief.  Urine from self cath has been dark.  No dysuria.  No f/c/r.  No n/v/d.  No trauma.  No rashes on the skin.    Past Medical History:  Diagnosis Date  . Asthma   . T7 spinal cord injury (HCC)     There are no active problems to display for this patient.   Past Surgical History:  Procedure Laterality Date  . SKIN GRAFT         Home Medications    Prior to Admission medications   Medication Sig Start Date End Date Taking? Authorizing Provider  traMADol (ULTRAM) 50 MG tablet Take by mouth every 6 (six) hours as needed for severe pain.   Yes Historical Provider, MD  oxybutynin (DITROPAN XL) 15 MG 24 hr tablet Take 15 mg by mouth 2 (two) times daily.    Historical Provider, MD    Family History No family history on file.  Social History Social History  Substance Use Topics  . Smoking status: Never Smoker  . Smokeless tobacco: Never Used  . Alcohol use No     Allergies   Sulfa antibiotics   Review of Systems Review of Systems  Respiratory: Negative for shortness of breath.   Cardiovascular: Negative for chest pain.  Gastrointestinal: Negative for constipation, diarrhea, nausea and vomiting.  Genitourinary: Positive for flank pain. Negative for discharge, dysuria and penile pain.  Neurological: Negative for  headaches.  All other systems reviewed and are negative.    Physical Exam Updated Vital Signs BP 122/86 (BP Location: Right Arm)   Pulse 111   Temp 98.2 F (36.8 C) (Oral)   Resp 16   Ht 5\' 7"  (1.702 m)   Wt 115 lb (52.2 kg)   SpO2 98%   BMI 18.01 kg/m   Physical Exam  Constitutional: He is oriented to person, place, and time. He appears well-developed and well-nourished. No distress.  HENT:  Head: Normocephalic and atraumatic.  Mouth/Throat: No oropharyngeal exudate.  Eyes: EOM are normal. Pupils are equal, round, and reactive to light.  Neck: Normal range of motion. Neck supple.  Cardiovascular: Normal rate, regular rhythm and normal heart sounds.   Pulmonary/Chest: Effort normal and breath sounds normal. No respiratory distress.  Abdominal: Soft. Bowel sounds are normal. He exhibits no mass. There is no tenderness. There is no rebound and no guarding.  Musculoskeletal: He exhibits no deformity.  Neurological: He is alert and oriented to person, place, and time.  Skin: Skin is warm and dry. Capillary refill takes less than 2 seconds.  Psychiatric: He has a normal mood and affect.     ED Treatments / Results   Vitals:   10/18/16 0222  BP: 122/86  Pulse: 111  Resp: 16  Temp: 98.2 F (36.8 C)   Results for orders placed or performed during the  hospital encounter of 10/18/16  Urinalysis, Routine w reflex microscopic (not at [facility])  Result Value Ref Range   Color, Urine YELLOW YELLOW   APPearance CLEAR CLEAR   Specific Gravity, Urine 1.025 1.005 - 1.030   pH 6.0 5.0 - 8.0   Glucose, UA NEGATIVE NEGATIVE mg/dL   Hgb urine dipstick NEGATIVE NEGATIVE   Bilirubin Urine NEGATIVE NEGATIVE   Ketones, ur NEGATIVE NEGATIVE mg/dL   Protein, ur NEGATIVE NEGATIVE mg/dL   Nitrite POSITIVE (A) NEGATIVE   Leukocytes, UA MODERATE (A) NEGATIVE  Urine microscopic-add on  Result Value Ref Range   Squamous Epithelial / LPF 0-5 (A) NONE SEEN   WBC, UA 6-30 0 - 5 WBC/hpf   RBC /  HPF 0-5 0 - 5 RBC/hpf   Bacteria, UA MANY (A) NONE SEEN   No results found.  Procedures Procedures (including critical care time)  Medications Ordered in ED Medications  ketorolac (TORADOL) injection 60 mg (not administered)  nitrofurantoin (macrocrystal-monohydrate) (MACROBID) capsule 100 mg (not administered)      Final Clinical Impressions(s) / ED Diagnoses  UTI:  Symptoms and history consistent with UTI.  No non verbal signs of pain.  Patient has been here frequently for same.  No reds and patient is too comfortable to have kidney stone.  Urine is not consistent with kidney stone.  No indication for imaging at this time  All questions answered to patient's satisfaction. Based on history and exam patient has been appropriately medically screened and emergency conditions excluded. Patient is stable for discharge at this time. Follow up with your PMD for recheck in 2 days and strict return precautions given. Will given referral to urology.      Cy BlamerApril Deano Tomaszewski, MD 10/18/16 [phone]    Cy BlamerApril Tacara Hadlock, MD 10/18/16 [phone]

## 2016-10-18 NOTE — ED Triage Notes (Signed)
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 States hx of kidney stones long time ago but kinda feels the same

## 2016-10-21 LAB — URINE CULTURE: Culture: >=100000 — AB

## 2016-10-22 ENCOUNTER — Telehealth (HOSPITAL_BASED_OUTPATIENT_CLINIC_OR_DEPARTMENT_OTHER): Payer: Self-pay | Admitting: Emergency Medicine

## 2016-10-22 NOTE — Telephone Encounter (Signed)
Post ED Visit - Positive Culture Follow-up  Culture report reviewed by antimicrobial stewardship pharmacist:  []  Enzo BiNathan Batchelder, Pharm.D. []  Celedonio MiyamotoJeremy Frens, Pharm.D., BCPS []  Garvin FilaMike Maccia, Pharm.D. []  Georgina PillionElizabeth Martin, [address]Pharm[address]D., BCPS []  ElbertMinh Pham, [place]arm[place]D., BCPS, AAHIVP []  Estella HuskMichelle Turner, Pharm.D., BCPS, AAHIVP []  Tennis Mustassie Stewart, Pharm.D. []  Rob PecosVincent, [address]Pharm[address] Devota Pacearoline Welles PharmD  Positive urine culture Treated with nitrofurantoin, organism sensitive to the same and no further patient follow-up is required at this time.  Berle MullMiller, Erin Obando 10/22/2016, 11:38 AM

## 2017-10-18 ENCOUNTER — Other Ambulatory Visit: Payer: Self-pay

## 2017-10-18 ENCOUNTER — Encounter (HOSPITAL_BASED_OUTPATIENT_CLINIC_OR_DEPARTMENT_OTHER): Payer: Self-pay | Admitting: *Deleted

## 2017-10-18 DIAGNOSIS — N3000 Acute cystitis without hematuria: Secondary | ICD-10-CM | POA: Diagnosis not present

## 2017-10-18 DIAGNOSIS — J45909 Unspecified asthma, uncomplicated: Secondary | ICD-10-CM | POA: Insufficient documentation

## 2017-10-18 DIAGNOSIS — R1030 Lower abdominal pain, unspecified: Secondary | ICD-10-CM | POA: Diagnosis present

## 2017-10-18 NOTE — ED Triage Notes (Signed)
Pt reports sx of UTI x 2 days. Urine has foul odor. Pt self caths

## 2017-10-19 ENCOUNTER — Emergency Department (HOSPITAL_BASED_OUTPATIENT_CLINIC_OR_DEPARTMENT_OTHER)
Admission: EM | Admit: 2017-10-19 | Discharge: 2017-10-19 | Disposition: A | Discharge status: Home / Self Care | Payer: Medicaid Other | Attending: Emergency Medicine | Admitting: Emergency Medicine

## 2017-10-19 DIAGNOSIS — N3000 Acute cystitis without hematuria: Secondary | ICD-10-CM

## 2017-10-19 LAB — URINALYSIS, MICROSCOPIC (REFLEX)

## 2017-10-19 LAB — URINALYSIS, ROUTINE W REFLEX MICROSCOPIC
Bilirubin Urine: NEGATIVE
GLUCOSE, UA: NEGATIVE mg/dL
KETONES UR: NEGATIVE mg/dL
Nitrite: POSITIVE — AB
PROTEIN: NEGATIVE mg/dL
Specific Gravity, Urine: 1.025 (ref 1.005–1.030)
pH: 6 (ref 5.0–8.0)

## 2017-10-19 MED ORDER — NITROFURANTOIN MONOHYD MACRO 100 MG PO CAPS
100.0000 mg | ORAL_CAPSULE | Freq: Once | ORAL | Status: AC
  Administered 2017-10-19: 100 mg via ORAL
  Filled 2017-10-19: qty 1

## 2017-10-19 MED ORDER — NITROFURANTOIN MONOHYD MACRO 100 MG PO CAPS: 100.0000 mg | ORAL_CAPSULE | Freq: Two times a day (BID) | ORAL | 0 refills | Status: DC

## 2017-10-19 NOTE — ED Notes (Signed)
Alert, NAD, calm, interactive, resps e/u, speaking in clear complete sentences, no dyspnea noted, skin W&D, VSS, c/o 2 days of dark strong smelling urine, some intermittent R bladder pain, also subjective fever, (denies: pain at this time, back pain, sob, NVD, dizziness or visual changes). Attempting self cath for urine sample at this time. Does not take maintenance ABT. Last UTI ~ 5 months ago.
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
## 2017-10-19 NOTE — ED Notes (Signed)
No changes, updated.

## 2017-10-21 LAB — URINE CULTURE

## 2017-10-22 ENCOUNTER — Telehealth: Payer: Self-pay | Admitting: Emergency Medicine

## 2017-10-22 NOTE — Telephone Encounter (Signed)
Post ED Visit - Positive Culture Follow-up  Culture report reviewed by antimicrobial stewardship pharmacist:  []  Enzo BiNathan Batchelder, Pharm.D. []  Celedonio MiyamotoJeremy Frens, Pharm.D., BCPS AQ-ID []  Garvin FilaMike Maccia, Pharm.D., BCPS []  Georgina PillionElizabeth Martin, Pharm.D., BCPS []  SwanseaMinh Pham, [address]Pharm.D., BCPS, AAHIVP []  Estella HuskMichelle Turner, Pharm.D., BCPS, AAHIVP []  Lysle Pearlachel Rumbarger, PharmD, BCPS []  Casilda Carlsaylor Stone, PharmD, BCPS []  Pollyann SamplesAndy Johnston, PharmD, BCPS  Positive urine culture Treated with nitrofurantoin, organism sensitive to the same and no further patient follow-up is required at this time.  Berle MullMiller, Laurice Iglesia 10/22/2017, 9:55 AM

## 2017-11-29 ENCOUNTER — Emergency Department (HOSPITAL_BASED_OUTPATIENT_CLINIC_OR_DEPARTMENT_OTHER)
Admission: EM | Admit: 2017-11-29 | Discharge: 2017-11-29 | Disposition: A | Discharge status: Home / Self Care | Payer: Medicaid Other | Attending: Emergency Medicine | Admitting: Emergency Medicine

## 2017-11-29 ENCOUNTER — Encounter (HOSPITAL_BASED_OUTPATIENT_CLINIC_OR_DEPARTMENT_OTHER): Payer: Self-pay

## 2017-11-29 ENCOUNTER — Other Ambulatory Visit: Payer: Self-pay

## 2017-11-29 ENCOUNTER — Emergency Department (HOSPITAL_BASED_OUTPATIENT_CLINIC_OR_DEPARTMENT_OTHER): Payer: Medicaid Other

## 2017-11-29 DIAGNOSIS — N3000 Acute cystitis without hematuria: Secondary | ICD-10-CM | POA: Diagnosis not present

## 2017-11-29 DIAGNOSIS — J45909 Unspecified asthma, uncomplicated: Secondary | ICD-10-CM | POA: Diagnosis not present

## 2017-11-29 DIAGNOSIS — M25512 Pain in left shoulder: Secondary | ICD-10-CM | POA: Insufficient documentation

## 2017-11-29 DIAGNOSIS — Z79899 Other long term (current) drug therapy: Secondary | ICD-10-CM | POA: Insufficient documentation

## 2017-11-29 LAB — URINALYSIS, ROUTINE W REFLEX MICROSCOPIC
Bilirubin Urine: NEGATIVE
GLUCOSE, UA: NEGATIVE mg/dL
KETONES UR: NEGATIVE mg/dL
Nitrite: POSITIVE — AB
PROTEIN: NEGATIVE mg/dL
Specific Gravity, Urine: 1.01 (ref 1.005–1.030)
pH: 6.5 (ref 5.0–8.0)

## 2017-11-29 LAB — URINALYSIS, MICROSCOPIC (REFLEX)

## 2017-11-29 MED ORDER — CEPHALEXIN 500 MG PO CAPS: 500.0000 mg | ORAL_CAPSULE | Freq: Four times a day (QID) | ORAL | 0 refills | Status: DC
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
## 2017-11-29 NOTE — Discharge Instructions (Signed)
Keflex as prescribed.  Ibuprofen 600 mg every 6 hours as needed for pain.  Follow-up with your primary doctor if not improving in the next 3-4 days.

## 2017-11-29 NOTE — ED Triage Notes (Signed)
Pt c/o lt shoulder pain, denies injury; pt is wheelchair bound; pt also complaining of UTI s/s

## 2018-04-29 ENCOUNTER — Encounter (HOSPITAL_BASED_OUTPATIENT_CLINIC_OR_DEPARTMENT_OTHER): Payer: Self-pay

## 2018-04-29 ENCOUNTER — Emergency Department (HOSPITAL_BASED_OUTPATIENT_CLINIC_OR_DEPARTMENT_OTHER)
Admission: EM | Admit: 2018-04-29 | Discharge: 2018-04-29 | Disposition: A | Discharge status: Home / Self Care | Payer: Medicaid Other | Attending: Emergency Medicine | Admitting: Emergency Medicine

## 2018-04-29 ENCOUNTER — Other Ambulatory Visit: Payer: Self-pay

## 2018-04-29 DIAGNOSIS — J45909 Unspecified asthma, uncomplicated: Secondary | ICD-10-CM | POA: Insufficient documentation

## 2018-04-29 DIAGNOSIS — Z8669 Personal history of other diseases of the nervous system and sense organs: Secondary | ICD-10-CM

## 2018-04-29 DIAGNOSIS — N390 Urinary tract infection, site not specified: Secondary | ICD-10-CM | POA: Diagnosis not present

## 2018-04-29 DIAGNOSIS — R300 Dysuria: Secondary | ICD-10-CM | POA: Diagnosis present

## 2018-04-29 DIAGNOSIS — Z79899 Other long term (current) drug therapy: Secondary | ICD-10-CM | POA: Insufficient documentation

## 2018-04-29 HISTORY — DX: Urinary tract infection, site not specified: N39.0

## 2018-04-29 LAB — URINALYSIS, ROUTINE W REFLEX MICROSCOPIC
BILIRUBIN URINE: NEGATIVE
GLUCOSE, UA: NEGATIVE mg/dL
HGB URINE DIPSTICK: NEGATIVE
KETONES UR: NEGATIVE mg/dL
Nitrite: POSITIVE — AB
PH: 7 (ref 5.0–8.0)
Protein, ur: NEGATIVE mg/dL
Specific Gravity, Urine: 1.02 (ref 1.005–1.030)

## 2018-04-29 LAB — URINALYSIS, MICROSCOPIC (REFLEX)

## 2018-04-29 MED ORDER — CIPROFLOXACIN HCL 500 MG PO TABS: 500.0000 mg | ORAL_TABLET | Freq: Two times a day (BID) | ORAL | 0 refills | Status: DC
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
## 2018-04-29 NOTE — Discharge Instructions (Addendum)
Cipro as prescribed.  Follow-up with your primary doctor if symptoms are not improving in the next 2 to 3 days.

## 2018-04-29 NOTE — ED Triage Notes (Signed)
Pt c/o discolored urine with foul smell and right flank pain for the last three days, denies fevers, pt has spinal cord injury and self caths, frequent UTI history

## 2018-09-23 ENCOUNTER — Emergency Department (HOSPITAL_BASED_OUTPATIENT_CLINIC_OR_DEPARTMENT_OTHER)
Admission: EM | Admit: 2018-09-23 | Discharge: 2018-09-23 | Disposition: A | Discharge status: Home / Self Care | Payer: Medicaid Other | Attending: Emergency Medicine | Admitting: Emergency Medicine

## 2018-09-23 ENCOUNTER — Other Ambulatory Visit: Payer: Self-pay

## 2018-09-23 ENCOUNTER — Encounter (HOSPITAL_BASED_OUTPATIENT_CLINIC_OR_DEPARTMENT_OTHER): Payer: Self-pay | Admitting: Emergency Medicine

## 2018-09-23 DIAGNOSIS — G8220 Paraplegia, unspecified: Secondary | ICD-10-CM | POA: Diagnosis not present

## 2018-09-23 DIAGNOSIS — R399 Unspecified symptoms and signs involving the genitourinary system: Secondary | ICD-10-CM

## 2018-09-23 DIAGNOSIS — R82998 Other abnormal findings in urine: Secondary | ICD-10-CM | POA: Diagnosis present

## 2018-09-23 DIAGNOSIS — Z79899 Other long term (current) drug therapy: Secondary | ICD-10-CM | POA: Diagnosis not present

## 2018-09-23 DIAGNOSIS — J45909 Unspecified asthma, uncomplicated: Secondary | ICD-10-CM | POA: Diagnosis not present

## 2018-09-23 HISTORY — DX: Accidental discharge from unspecified firearms or gun, initial encounter: W34.00XA

## 2018-09-23 HISTORY — DX: Paraplegia, unspecified: G82.20

## 2018-09-23 LAB — URINALYSIS, COMPLETE (UACMP) WITH MICROSCOPIC
Bilirubin Urine: NEGATIVE
GLUCOSE, UA: NEGATIVE mg/dL
Hgb urine dipstick: NEGATIVE
Ketones, ur: NEGATIVE mg/dL
Nitrite: NEGATIVE
PH: 6 (ref 5.0–8.0)
PROTEIN: NEGATIVE mg/dL
RBC / HPF: NONE SEEN RBC/hpf (ref 0–5)
Specific Gravity, Urine: 1.025 (ref 1.005–1.030)

## 2018-09-23 MED ORDER — CEPHALEXIN 500 MG PO CAPS
500.0000 mg | ORAL_CAPSULE | Freq: Four times a day (QID) | ORAL | 0 refills | Status: DC
Start: 2018-09-23 — End: 2019-12-08
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
## 2018-09-23 NOTE — ED Triage Notes (Signed)
Pt self-cath.  Reports that he has noticed malodorous urine and different color urine the past several days. Denies fever.

## 2018-09-23 NOTE — ED Notes (Signed)
Pt. Reports he saw his urine getting dark and smelled a bad odor to his urine so he felt he may be getting a  UTI.  Pt. Here in case he needs antibiotics.

## 2018-09-23 NOTE — Discharge Instructions (Addendum)
Today for urinary tract symptoms.  Your urinalysis is not clearly a urinary tract infection.  Urine culture was sent.  Given your history, you will be given a prescription of antibiotics.  However, hold antibiotics until your culture results are available.  You are at increased risk for bacterial resistance.  If you develop fever, nausea, vomiting you should be reevaluated.

## 2018-09-25 LAB — URINE CULTURE

## 2018-09-26 ENCOUNTER — Telehealth: Payer: Self-pay

## 2018-09-26 NOTE — Telephone Encounter (Signed)
Post ED Visit - Positive Culture Follow-up  Culture report reviewed by antimicrobial stewardship pharmacist:  []  Enzo Bi, Pharm.D. []  Celedonio Miyamoto, Pharm.D., BCPS AQ-ID []  Garvin Fila, Pharm.D., BCPS []  Georgina Pillion, [address].D., BCPS []  [place], [address].D., BCPS, AAHIVP []  [name], Pharm.D., BCPS, AAHIVP []  Lysle Pearl, PharmD, BCPS []  Phillips Climes, PharmD, BCPS []  Agapito Games, PharmD, BCPS []  Verlan Friends, PharmD [facility] Pharm D Positive urine culture Treated with Cephalexin, organism sensitive to the same and no further patient follow-up is required at this time.  Jerry Caras 09/26/2018, 11:07 AM

## 2019-12-08 ENCOUNTER — Other Ambulatory Visit: Payer: Self-pay

## 2019-12-08 ENCOUNTER — Emergency Department (HOSPITAL_BASED_OUTPATIENT_CLINIC_OR_DEPARTMENT_OTHER)
Admission: EM | Admit: 2019-12-08 | Discharge: 2019-12-08 | Disposition: A | Discharge status: Home / Self Care | Payer: Medicaid Other | Attending: Emergency Medicine | Admitting: Emergency Medicine

## 2019-12-08 ENCOUNTER — Emergency Department (HOSPITAL_BASED_OUTPATIENT_CLINIC_OR_DEPARTMENT_OTHER): Payer: Medicaid Other

## 2019-12-08 ENCOUNTER — Encounter (HOSPITAL_BASED_OUTPATIENT_CLINIC_OR_DEPARTMENT_OTHER): Payer: Self-pay

## 2019-12-08 DIAGNOSIS — N200 Calculus of kidney: Secondary | ICD-10-CM | POA: Insufficient documentation

## 2019-12-08 DIAGNOSIS — R3989 Other symptoms and signs involving the genitourinary system: Secondary | ICD-10-CM | POA: Diagnosis present

## 2019-12-08 DIAGNOSIS — J45909 Unspecified asthma, uncomplicated: Secondary | ICD-10-CM | POA: Diagnosis not present

## 2019-12-08 DIAGNOSIS — N3000 Acute cystitis without hematuria: Secondary | ICD-10-CM | POA: Insufficient documentation

## 2019-12-08 DIAGNOSIS — Z79899 Other long term (current) drug therapy: Secondary | ICD-10-CM | POA: Diagnosis not present

## 2019-12-08 DIAGNOSIS — N401 Enlarged prostate with lower urinary tract symptoms: Secondary | ICD-10-CM | POA: Diagnosis not present

## 2019-12-08 DIAGNOSIS — N400 Enlarged prostate without lower urinary tract symptoms: Secondary | ICD-10-CM

## 2019-12-08 LAB — CBC WITH DIFFERENTIAL/PLATELET
Abs Immature Granulocytes: 0.04 10*3/uL (ref 0.00–0.07)
Basophils Absolute: 0 10*3/uL (ref 0.0–0.1)
Basophils Relative: 0 %
Eosinophils Absolute: 0.2 10*3/uL (ref 0.0–0.5)
Eosinophils Relative: 2 %
HCT: 42.1 % (ref 39.0–52.0)
Hemoglobin: 14.2 g/dL (ref 13.0–17.0)
Immature Granulocytes: 0 %
Lymphocytes Relative: 19 %
Lymphs Abs: 2.1 10*3/uL (ref 0.7–4.0)
MCH: 27.2 pg (ref 26.0–34.0)
MCHC: 33.7 g/dL (ref 30.0–36.0)
MCV: 80.7 fL (ref 80.0–100.0)
Monocytes Absolute: 0.6 10*3/uL (ref 0.1–1.0)
Monocytes Relative: 5 %
Neutro Abs: 8.1 10*3/uL — ABNORMAL HIGH (ref 1.7–7.7)
Neutrophils Relative %: 74 %
Platelets: 153 10*3/uL (ref 150–400)
RBC: 5.22 MIL/uL (ref 4.22–5.81)
RDW: 13.6 % (ref 11.5–15.5)
WBC: 11 10*3/uL — ABNORMAL HIGH (ref 4.0–10.5)
nRBC: 0 % (ref 0.0–0.2)

## 2019-12-08 LAB — URINALYSIS, ROUTINE W REFLEX MICROSCOPIC
Bilirubin Urine: NEGATIVE
Glucose, UA: NEGATIVE mg/dL
Ketones, ur: NEGATIVE mg/dL
Nitrite: POSITIVE — AB
Protein, ur: 30 mg/dL — AB
Specific Gravity, Urine: 1.025 (ref 1.005–1.030)
pH: 6 (ref 5.0–8.0)

## 2019-12-08 LAB — COMPREHENSIVE METABOLIC PANEL
ALT: 18 U/L (ref 0–44)
AST: 22 U/L (ref 15–41)
Albumin: 4.1 g/dL (ref 3.5–5.0)
Alkaline Phosphatase: 56 U/L (ref 38–126)
Anion gap: 10 (ref 5–15)
BUN: 18 mg/dL (ref 6–20)
CO2: 29 mmol/L (ref 22–32)
Calcium: 9 mg/dL (ref 8.9–10.3)
Chloride: 99 mmol/L (ref 98–111)
Creatinine, Ser: 0.99 mg/dL (ref 0.61–1.24)
GFR calc Af Amer: >60 mL/min (ref >60)
GFR calc non Af Amer: >60 mL/min (ref >60)
Glucose, Bld: 94 mg/dL (ref 70–99)
Potassium: 3.6 mmol/L (ref 3.5–5.1)
Sodium: 138 mmol/L (ref 135–145)
Total Bilirubin: 0.6 mg/dL (ref 0.3–1.2)
Total Protein: 7.8 g/dL (ref 6.5–8.1)

## 2019-12-08 LAB — URINALYSIS, MICROSCOPIC (REFLEX)

## 2019-12-08 MED ORDER — CEPHALEXIN 500 MG PO CAPS: 500.0000 mg | ORAL_CAPSULE | Freq: Four times a day (QID) | ORAL | 0 refills | Status: AC
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
## 2019-12-08 NOTE — Discharge Instructions (Addendum)
As discussed, your urine showed you have a urinary tract infection. Your CT scan showed some kidney stones that will pass on their own. The scan also showed an enlarged prostate. I am sending you home with an antibiotic for the UTI. Take 4 times a day for 7 days. Follow-up with your PCP within the next week to re-evaluate your symptoms and to check your prostate. Make sure to finish all antibiotics. Return to the ER for new or worsening symptoms.

## 2019-12-08 NOTE — ED Triage Notes (Addendum)
Pt c/o urinary "leakage, dark color and odor" x 4 days-pt states he self caths-pt NAD-to triage in own w/c

## 2019-12-11 LAB — URINE CULTURE: Culture: 40000 — AB

## 2019-12-12 ENCOUNTER — Telehealth: Payer: Self-pay | Admitting: Emergency Medicine

## 2019-12-12 ENCOUNTER — Telehealth (HOSPITAL_COMMUNITY): Payer: Self-pay | Admitting: Pharmacist

## 2019-12-12 NOTE — Telephone Encounter (Signed)
Post ED Visit - Positive Culture Follow-up: Successful Patient Follow-Up  Culture assessed and recommendations reviewed by:  []  , Pharm.D. []  Enzo Bi, Pharm.D., BCPS AQ-ID []  , Pharm.D., BCPS []  Celedonio Miyamoto, .D., BCPS []  [place], .D., BCPS, AAHIVP []  Georgina Pillion, Pharm.D., BCPS, AAHIVP []  [address], PharmD, BCPS []  , PharmD, BCPS [x]  [place], PharmD, BCPS []  [address], PharmD  Positive urine culture  []  Patient discharged without antimicrobial prescription and treatment is now indicated [x]  Organism is resistant to prescribed ED discharge antimicrobial []  Patient with positive blood cultures  Changes discussed with ED provider: New antibiotic prescription stop cephalexin, change to Bactrim DS 1 tab po bid x 7 days  Attempting to contact patient   [name] 12/12/2019, 11:45 AM

## 2019-12-12 NOTE — Progress Notes (Signed)
ED Antimicrobial Stewardship Positive Culture Follow Up   Mark Simmons is an 39 y.o. male who presented to [facility] on (Not on file) with a chief complaint of No chief complaint on file.   Recent Results (from the past 720 hour(s))  Urine culture     Status: Abnormal   Collection Time: 12/08/19  4:12 PM   Specimen: Urine, Random  Result Value Ref Range Status   Specimen Description   Final    URINE, RANDOM Performed at [facility], [address] [place], [place] [address]    Special Requests   Final    NONE Performed at [facility], [address] [place], [place] [address]    Culture 40,000 COLONIES/mL ENTEROBACTER AEROGENES (A)  Final   Report Status 12/11/2019 FINAL  Final   Organism ID, Bacteria ENTEROBACTER AEROGENES (A)  Final      Susceptibility   Enterobacter aerogenes - MIC*    CEFAZOLIN >=64 RESISTANT Resistant     CEFTRIAXONE >=64 RESISTANT Resistant     CIPROFLOXACIN <=0.25 SENSITIVE Sensitive     GENTAMICIN <=1 SENSITIVE Sensitive     IMIPENEM 8 INTERMEDIATE Intermediate     NITROFURANTOIN 128 RESISTANT Resistant     TRIMETH/SULFA <=20 SENSITIVE Sensitive     PIP/TAZO >=128 RESISTANT Resistant     * 40,000 COLONIES/mL ENTEROBACTER AEROGENES    [x]  Treated with Keflex, organism resistant to prescribed antimicrobial []  Patient discharged originally without antimicrobial agent and treatment is now indicated  New antibiotic prescription: Bacrim DS 1 tab po BID x 7 days  ED Provider: , PA-C   Brennah Quraishi 12/12/2019, 11:06 AM Clinical Pharmacist Monday - Friday phone -  [phone] Saturday - Sunday phone - [phone]

## 2019-12-17 ENCOUNTER — Telehealth: Payer: Self-pay | Admitting: *Deleted

## 2019-12-17 NOTE — Telephone Encounter (Signed)
Post ED Visit - Positive Culture Follow-up: Successful Patient Follow-Up  Culture assessed and recommendations reviewed by:  []  , Pharm.D. []  Enzo Bi, Pharm.D., BCPS AQ-ID []  , Pharm.D., BCPS []  Celedonio Miyamoto, Pharm.D., BCPS []  [place], Garvin Fila.D., BCPS, AAHIVP []  , Pharm.D., BCPS, AAHIVP []  Georgina Pillion, PharmD, BCPS []  , PharmD, BCPS []  [place], PharmD, BCPS []  [address], PharmD  Positive urine culture  []  Patient discharged without antimicrobial prescription and treatment is now indicated [x]  Organism is resistant to prescribed ED discharge antimicrobial []  Patient with positive blood cultures  Changes discussed with ED provider: , PA New antibiotic prescription Bactrim DS po BID x 7 days Called to [name]. Main/W [phone]  Contacted patient, date 12/17/2019, time 1530   Phillips Climes 12/17/2019, 3:26 PM

## 2020-04-10 ENCOUNTER — Other Ambulatory Visit: Payer: Self-pay

## 2020-04-10 ENCOUNTER — Emergency Department (HOSPITAL_BASED_OUTPATIENT_CLINIC_OR_DEPARTMENT_OTHER)
Admission: EM | Admit: 2020-04-10 | Discharge: 2020-04-10 | Disposition: A | Discharge status: Home / Self Care | Payer: Medicaid Other | Attending: Emergency Medicine | Admitting: Emergency Medicine

## 2020-04-10 ENCOUNTER — Encounter (HOSPITAL_BASED_OUTPATIENT_CLINIC_OR_DEPARTMENT_OTHER): Payer: Self-pay

## 2020-04-10 DIAGNOSIS — G8220 Paraplegia, unspecified: Secondary | ICD-10-CM | POA: Diagnosis not present

## 2020-04-10 DIAGNOSIS — N390 Urinary tract infection, site not specified: Secondary | ICD-10-CM | POA: Diagnosis present

## 2020-04-10 DIAGNOSIS — Z79899 Other long term (current) drug therapy: Secondary | ICD-10-CM | POA: Insufficient documentation

## 2020-04-10 LAB — URINALYSIS, ROUTINE W REFLEX MICROSCOPIC
Bilirubin Urine: NEGATIVE
Glucose, UA: NEGATIVE mg/dL
Hgb urine dipstick: NEGATIVE
Ketones, ur: NEGATIVE mg/dL
Nitrite: NEGATIVE
Protein, ur: NEGATIVE mg/dL
Specific Gravity, Urine: 1.02 (ref 1.005–1.030)
pH: 7 (ref 5.0–8.0)

## 2020-04-10 LAB — URINALYSIS, MICROSCOPIC (REFLEX)
RBC / HPF: NONE SEEN RBC/hpf (ref 0–5)
Squamous Epithelial / LPF: NONE SEEN (ref 0–5)

## 2020-04-10 MED ORDER — CEPHALEXIN 500 MG PO CAPS: 500.0000 mg | ORAL_CAPSULE | Freq: Three times a day (TID) | ORAL | 0 refills | Status: DC

## 2020-04-10 NOTE — ED Triage Notes (Signed)
Pt c/o urine having a foul odor and having urinary leakage between self cath attempts. Pt states symptoms are similar to UTIs he has had in the past.

## 2020-04-10 NOTE — Discharge Instructions (Addendum)
You were seen today for urinary symptoms.  You will be placed on antibiotics.  If you develop fevers or any systemic symptoms you should be reevaluated.
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
## 2020-04-12 LAB — URINE CULTURE: Culture: >=100000 — AB

## 2020-04-13 ENCOUNTER — Telehealth: Payer: Self-pay

## 2020-04-13 NOTE — Telephone Encounter (Signed)
Post ED Visit - Positive Culture Follow-up  Culture report reviewed by antimicrobial stewardship pharmacist: [facility] []  , Pharm.D. []  Enzo Bi, Pharm.D., BCPS AQ-ID []  , Pharm.D., BCPS []  Celedonio Miyamoto, Pharm.D., BCPS []  [place], Garvin Fila.D., BCPS, AAHIVP []  , Pharm.D., BCPS, AAHIVP []  Georgina Pillion, PharmD, BCPS []  , PharmD, BCPS []  [place], PharmD, BCPS []  [address], PharmD []  , PharmD, BCPS []  [name], PharmD [facility] []  Lysle Pearl, PharmD []  , PharmD []  Phillips Climes, PharmD []  , Rph []  Agapito Games) , PharmD []  Verlan Friends, PharmD []  , PharmD []  Mervyn Gay, PharmD []  , PharmD []  Vinnie Level, PharmD []  Yolande Jolly, PharmD []  , PharmD []  Len Childs, PharmD   Positive urine culture Treated with Cephalexin, organism sensitive to the same and no further patient follow-up is required at this time.  04/13/2020, 9:44 AM

## 2020-06-21 ENCOUNTER — Other Ambulatory Visit: Payer: Self-pay

## 2020-06-21 DIAGNOSIS — N3000 Acute cystitis without hematuria: Secondary | ICD-10-CM | POA: Insufficient documentation

## 2020-06-21 DIAGNOSIS — J45909 Unspecified asthma, uncomplicated: Secondary | ICD-10-CM | POA: Insufficient documentation

## 2020-06-21 DIAGNOSIS — Z79899 Other long term (current) drug therapy: Secondary | ICD-10-CM | POA: Insufficient documentation

## 2020-06-22 ENCOUNTER — Emergency Department (HOSPITAL_BASED_OUTPATIENT_CLINIC_OR_DEPARTMENT_OTHER)
Admission: EM | Admit: 2020-06-22 | Discharge: 2020-06-22 | Disposition: A | Discharge status: Home / Self Care | Payer: Medicaid Other | Attending: Emergency Medicine | Admitting: Emergency Medicine

## 2020-06-22 ENCOUNTER — Encounter (HOSPITAL_BASED_OUTPATIENT_CLINIC_OR_DEPARTMENT_OTHER): Payer: Self-pay | Admitting: Emergency Medicine

## 2020-06-22 DIAGNOSIS — N3000 Acute cystitis without hematuria: Secondary | ICD-10-CM

## 2020-06-22 LAB — URINALYSIS, ROUTINE W REFLEX MICROSCOPIC
Bilirubin Urine: NEGATIVE
Glucose, UA: NEGATIVE mg/dL
Ketones, ur: NEGATIVE mg/dL
Nitrite: NEGATIVE
Protein, ur: NEGATIVE mg/dL
Specific Gravity, Urine: 1.02 (ref 1.005–1.030)
pH: 7.5 (ref 5.0–8.0)

## 2020-06-22 LAB — BASIC METABOLIC PANEL
Anion gap: 10 (ref 5–15)
BUN: 20 mg/dL (ref 6–20)
CO2: 29 mmol/L (ref 22–32)
Calcium: 8.7 mg/dL — ABNORMAL LOW (ref 8.9–10.3)
Chloride: 103 mmol/L (ref 98–111)
Creatinine, Ser: 1.23 mg/dL (ref 0.61–1.24)
GFR calc Af Amer: >60 mL/min (ref >60)
GFR calc non Af Amer: >60 mL/min (ref >60)
Glucose, Bld: 113 mg/dL — ABNORMAL HIGH (ref 70–99)
Potassium: 3.8 mmol/L (ref 3.5–5.1)
Sodium: 142 mmol/L (ref 135–145)

## 2020-06-22 LAB — URINALYSIS, MICROSCOPIC (REFLEX): WBC, UA: >50 WBC/hpf (ref 0–5)

## 2020-06-22 LAB — CBC
HCT: 37.4 % — ABNORMAL LOW (ref 39.0–52.0)
Hemoglobin: 12.8 g/dL — ABNORMAL LOW (ref 13.0–17.0)
MCH: 27.4 pg (ref 26.0–34.0)
MCHC: 34.2 g/dL (ref 30.0–36.0)
MCV: 80.1 fL (ref 80.0–100.0)
Platelets: 157 10*3/uL (ref 150–400)
RBC: 4.67 MIL/uL (ref 4.22–5.81)
RDW: 14 % (ref 11.5–15.5)
WBC: 6.3 10*3/uL (ref 4.0–10.5)
nRBC: 0 % (ref 0.0–0.2)

## 2020-06-22 MED ORDER — CEPHALEXIN 500 MG PO CAPS: 500.0000 mg | ORAL_CAPSULE | Freq: Three times a day (TID) | ORAL | 0 refills | Status: DC

## 2020-06-22 MED ORDER — CIPROFLOXACIN HCL 500 MG PO TABS: 500.0000 mg | ORAL_TABLET | Freq: Once | ORAL | Status: DC

## 2020-06-22 MED ORDER — CEPHALEXIN 500 MG PO CAPS
500.0000 mg | ORAL_CAPSULE | Freq: Three times a day (TID) | ORAL | 0 refills | Status: AC
Start: 2020-06-22 — End: 2020-07-02

## 2020-06-22 MED ORDER — CEPHALEXIN 250 MG PO CAPS
1000.0000 mg | ORAL_CAPSULE | Freq: Once | ORAL | Status: AC
  Administered 2020-06-22: 1000 mg via ORAL
  Filled 2020-06-22: qty 4
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
## 2020-06-22 NOTE — ED Triage Notes (Signed)
Pt c/o right flank pain x 1 week. Pt also endorses concern for UTI, reports cloudy urine. Pt self caths.

## 2020-06-24 LAB — URINE CULTURE: Culture: >=100000 — AB

## 2020-11-09 ENCOUNTER — Encounter (HOSPITAL_BASED_OUTPATIENT_CLINIC_OR_DEPARTMENT_OTHER): Payer: Self-pay | Admitting: *Deleted

## 2020-11-09 ENCOUNTER — Other Ambulatory Visit: Payer: Self-pay

## 2020-11-09 ENCOUNTER — Emergency Department (HOSPITAL_BASED_OUTPATIENT_CLINIC_OR_DEPARTMENT_OTHER)
Admission: EM | Admit: 2020-11-09 | Discharge: 2020-11-10 | Disposition: A | Discharge status: Home / Self Care | Payer: Medicaid Other | Attending: Emergency Medicine | Admitting: Emergency Medicine

## 2020-11-09 DIAGNOSIS — J45909 Unspecified asthma, uncomplicated: Secondary | ICD-10-CM | POA: Insufficient documentation

## 2020-11-09 DIAGNOSIS — R200 Anesthesia of skin: Secondary | ICD-10-CM | POA: Insufficient documentation

## 2020-11-09 DIAGNOSIS — N390 Urinary tract infection, site not specified: Secondary | ICD-10-CM | POA: Insufficient documentation

## 2020-11-09 LAB — URINALYSIS, MICROSCOPIC (REFLEX)

## 2020-11-09 LAB — URINALYSIS, ROUTINE W REFLEX MICROSCOPIC
Bilirubin Urine: NEGATIVE
Glucose, UA: NEGATIVE mg/dL
Ketones, ur: NEGATIVE mg/dL
Nitrite: NEGATIVE
Protein, ur: NEGATIVE mg/dL
Specific Gravity, Urine: 1.02 (ref 1.005–1.030)
pH: 6.5 (ref 5.0–8.0)

## 2020-11-09 NOTE — ED Triage Notes (Addendum)
He self caths. He has noticed an odor and urinary frequency x 1 week. For 2 weeks he has had muscle pain in his left arm that comes and goes. States it feels like he has had a workout at the gym.

## 2020-11-10 MED ORDER — SODIUM CHLORIDE 0.9 % IV SOLN
1.0000 g | Freq: Once | INTRAVENOUS | Status: AC
  Administered 2020-11-10: 1 g via INTRAVENOUS
  Filled 2020-11-10: qty 10

## 2020-11-10 MED ORDER — CEFPODOXIME PROXETIL 200 MG PO TABS: 200.0000 mg | ORAL_TABLET | Freq: Two times a day (BID) | ORAL | 0 refills | Status: DC
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
## 2020-11-12 LAB — URINE CULTURE: Culture: >=100000 — AB

## 2020-11-13 ENCOUNTER — Telehealth: Payer: Self-pay

## 2020-11-13 NOTE — Telephone Encounter (Signed)
Post ED Visit - Positive Culture Follow-up  Culture report reviewed by antimicrobial stewardship pharmacist: [facility] []  , Pharm.D. []  Enzo Bi, Pharm.D., BCPS AQ-ID []  , Pharm.D., BCPS []  Celedonio Miyamoto, .D., BCPS []  [place], .D., BCPS, AAHIVP []  Georgina Pillion, Pharm.D., BCPS, AAHIVP []  [address], PharmD, BCPS []  , PharmD, BCPS []  [place], PharmD, BCPS []  [address], PharmD []  , PharmD, BCPS []  [name], PharmD [facility] []  Lysle Pearl, PharmD []  , PharmD []  Phillips Climes, PharmD []  , Rph []  Agapito Games) , PharmD []  Verlan Friends, PharmD []  , PharmD []  Mervyn Gay, PharmD []  , PharmD []  Vinnie Level, PharmD []  Bufford Lope, PharmD []  , PharmD []  Len Childs, PharmD   Positive urine culture Treated with Cefpodoxime, organism sensitive to the same and no further patient follow-up is required at this time.  11/13/2020, 10:17 AM
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
## 2020-12-11 ENCOUNTER — Other Ambulatory Visit: Payer: Self-pay

## 2020-12-11 ENCOUNTER — Encounter (HOSPITAL_BASED_OUTPATIENT_CLINIC_OR_DEPARTMENT_OTHER): Payer: Self-pay

## 2020-12-11 ENCOUNTER — Emergency Department (HOSPITAL_BASED_OUTPATIENT_CLINIC_OR_DEPARTMENT_OTHER)
Admission: EM | Admit: 2020-12-11 | Discharge: 2020-12-11 | Disposition: A | Discharge status: Home / Self Care | Payer: Medicaid Other | Attending: Emergency Medicine | Admitting: Emergency Medicine

## 2020-12-11 DIAGNOSIS — N390 Urinary tract infection, site not specified: Secondary | ICD-10-CM | POA: Insufficient documentation

## 2020-12-11 DIAGNOSIS — J45909 Unspecified asthma, uncomplicated: Secondary | ICD-10-CM | POA: Diagnosis not present

## 2020-12-11 DIAGNOSIS — R300 Dysuria: Secondary | ICD-10-CM | POA: Diagnosis present

## 2020-12-11 LAB — URINALYSIS, ROUTINE W REFLEX MICROSCOPIC
Bilirubin Urine: NEGATIVE
Glucose, UA: NEGATIVE mg/dL
Ketones, ur: NEGATIVE mg/dL
Nitrite: POSITIVE — AB
Protein, ur: 30 mg/dL — AB
Specific Gravity, Urine: 1.03 (ref 1.005–1.030)
pH: 6 (ref 5.0–8.0)

## 2020-12-11 LAB — URINALYSIS, MICROSCOPIC (REFLEX)
RBC / HPF: >50 RBC/hpf (ref 0–5)
WBC, UA: >50 WBC/hpf (ref 0–5)

## 2020-12-11 MED ORDER — CEPHALEXIN 500 MG PO CAPS
500.0000 mg | ORAL_CAPSULE | Freq: Four times a day (QID) | ORAL | 0 refills | Status: DC
Start: 2020-12-11 — End: 2021-06-23

## 2020-12-11 MED ORDER — CEFTRIAXONE SODIUM 1 G IJ SOLR
1.0000 g | Freq: Once | INTRAMUSCULAR | Status: AC
  Administered 2020-12-11: 1 g via INTRAMUSCULAR
  Filled 2020-12-11: qty 10
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
## 2020-12-11 NOTE — ED Notes (Signed)
Patient denies pain and is resting comfortably.  

## 2020-12-11 NOTE — ED Triage Notes (Signed)
Pt believes he has a UTI. Pt self caths, states the urine is malodorous and is cloudy. States he felt he had chills yesterday. Denies abdominal pain.

## 2020-12-14 LAB — URINE CULTURE: Culture: >=100000 — AB

## 2021-06-22 ENCOUNTER — Encounter (HOSPITAL_BASED_OUTPATIENT_CLINIC_OR_DEPARTMENT_OTHER): Payer: Self-pay | Admitting: Emergency Medicine

## 2021-06-22 ENCOUNTER — Other Ambulatory Visit: Payer: Self-pay

## 2021-06-22 ENCOUNTER — Emergency Department (HOSPITAL_BASED_OUTPATIENT_CLINIC_OR_DEPARTMENT_OTHER)
Admission: EM | Admit: 2021-06-22 | Discharge: 2021-06-23 | Disposition: A | Discharge status: Home / Self Care | Payer: Medicare Other | Attending: Emergency Medicine | Admitting: Emergency Medicine

## 2021-06-22 DIAGNOSIS — J45909 Unspecified asthma, uncomplicated: Secondary | ICD-10-CM | POA: Insufficient documentation

## 2021-06-22 DIAGNOSIS — N390 Urinary tract infection, site not specified: Secondary | ICD-10-CM | POA: Insufficient documentation

## 2021-06-22 DIAGNOSIS — R300 Dysuria: Secondary | ICD-10-CM | POA: Diagnosis present

## 2021-06-22 NOTE — ED Triage Notes (Signed)
Patient presents with complaints of UTI sx x 2-3 days; states odor to urine; denies fever. Patient self caths as needed.

## 2021-06-23 DIAGNOSIS — N390 Urinary tract infection, site not specified: Secondary | ICD-10-CM | POA: Diagnosis not present

## 2021-06-23 LAB — RAPID URINE DRUG SCREEN, HOSP PERFORMED
Amphetamines: NOT DETECTED
Barbiturates: NOT DETECTED
Benzodiazepines: NOT DETECTED
Cocaine: NOT DETECTED
Opiates: POSITIVE — AB
Tetrahydrocannabinol: NOT DETECTED

## 2021-06-23 LAB — URINALYSIS, ROUTINE W REFLEX MICROSCOPIC
Bilirubin Urine: NEGATIVE
Glucose, UA: NEGATIVE mg/dL
Ketones, ur: NEGATIVE mg/dL
Nitrite: POSITIVE — AB
Protein, ur: NEGATIVE mg/dL
Specific Gravity, Urine: 1.02 (ref 1.005–1.030)
pH: 6.5 (ref 5.0–8.0)

## 2021-06-23 LAB — URINALYSIS, MICROSCOPIC (REFLEX)

## 2021-06-23 MED ORDER — CEPHALEXIN 250 MG PO CAPS
500.0000 mg | ORAL_CAPSULE | Freq: Once | ORAL | Status: AC
  Administered 2021-06-23: 500 mg via ORAL
  Filled 2021-06-23: qty 2

## 2021-06-23 MED ORDER — CEPHALEXIN 500 MG PO CAPS: 500.0000 mg | ORAL_CAPSULE | Freq: Four times a day (QID) | ORAL | 0 refills | Status: AC
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
## 2021-06-23 NOTE — Discharge Instructions (Addendum)
You were evaluated in the Emergency Department and after careful evaluation, we did not find any emergent condition requiring admission or further testing in the hospital.  Your exam/testing today was overall reassuring.  Urine sample showing evidence of infection.  Please take the Keflex antibiotic as directed and follow-up with your regular doctors.  Please return to the Emergency Department if you experience any worsening of your condition.  Thank you for allowing [place] to be a part of your care.

## 2021-06-24 LAB — URINE CULTURE

## 2021-08-15 ENCOUNTER — Emergency Department (HOSPITAL_BASED_OUTPATIENT_CLINIC_OR_DEPARTMENT_OTHER)
Admission: EM | Admit: 2021-08-15 | Discharge: 2021-08-15 | Disposition: A | Discharge status: Home / Self Care | Payer: Medicare Other | Attending: Emergency Medicine | Admitting: Emergency Medicine

## 2021-08-15 ENCOUNTER — Encounter (HOSPITAL_BASED_OUTPATIENT_CLINIC_OR_DEPARTMENT_OTHER): Payer: Self-pay

## 2021-08-15 ENCOUNTER — Other Ambulatory Visit: Payer: Self-pay

## 2021-08-15 DIAGNOSIS — B9689 Other specified bacterial agents as the cause of diseases classified elsewhere: Secondary | ICD-10-CM | POA: Insufficient documentation

## 2021-08-15 DIAGNOSIS — N3001 Acute cystitis with hematuria: Secondary | ICD-10-CM | POA: Diagnosis not present

## 2021-08-15 DIAGNOSIS — J45909 Unspecified asthma, uncomplicated: Secondary | ICD-10-CM | POA: Insufficient documentation

## 2021-08-15 DIAGNOSIS — R339 Retention of urine, unspecified: Secondary | ICD-10-CM | POA: Diagnosis present

## 2021-08-15 DIAGNOSIS — N3000 Acute cystitis without hematuria: Secondary | ICD-10-CM

## 2021-08-15 LAB — URINALYSIS, ROUTINE W REFLEX MICROSCOPIC
Bilirubin Urine: NEGATIVE
Glucose, UA: NEGATIVE mg/dL
Hgb urine dipstick: NEGATIVE
Ketones, ur: NEGATIVE mg/dL
Nitrite: POSITIVE — AB
Protein, ur: NEGATIVE mg/dL
Specific Gravity, Urine: 1.02 (ref 1.005–1.030)
pH: 6.5 (ref 5.0–8.0)

## 2021-08-15 LAB — URINALYSIS, MICROSCOPIC (REFLEX)

## 2021-08-15 MED ORDER — CEFTRIAXONE SODIUM 1 G IJ SOLR
1.0000 g | Freq: Once | INTRAMUSCULAR | Status: AC
  Administered 2021-08-15: 1 g via INTRAMUSCULAR
  Filled 2021-08-15: qty 10

## 2021-08-15 MED ORDER — NITROFURANTOIN MONOHYD MACRO 100 MG PO CAPS: 100.0000 mg | ORAL_CAPSULE | Freq: Two times a day (BID) | ORAL | 0 refills | Status: DC
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
## 2021-08-17 LAB — URINE CULTURE: Culture: >=100000 — AB

## 2021-08-19 ENCOUNTER — Telehealth: Payer: Self-pay | Admitting: Emergency Medicine

## 2021-08-19 NOTE — Telephone Encounter (Signed)
Post ED Visit - Positive Culture Follow-up  Culture report reviewed by antimicrobial stewardship pharmacist: [facility] []  , Pharm.D. []  Enzo Bi, Pharm.D., BCPS AQ-ID []  , Pharm.D., BCPS []  Celedonio Miyamoto, Pharm.D., BCPS []  Refugio, Garvin Fila.D., BCPS, AAHIVP []  , Pharm.D., BCPS, AAHIVP [x]  Georgina Pillion, PharmD, BCPS []  , PharmD, BCPS []  [place], PharmD, BCPS []  [address], PharmD []  , PharmD, BCPS []  [name], PharmD  Pharmacy Team []  Lysle Pearl, PharmD []  , PharmD []  Phillips Climes, PharmD []  , Rph []  Agapito Games) , PharmD []  Verlan Friends, PharmD []  , PharmD []  Mervyn Gay, PharmD []  , PharmD []  Vinnie Level, PharmD []  [facility], PharmD []  , PharmD []  Len Childs, PharmD   Positive urine culture Treated with Nitrofurantoin, organism sensitive to the same and no further patient follow-up is required at this time.  Mark Simmons 08/19/2021, 11:54 AM

## 2022-02-18 ENCOUNTER — Other Ambulatory Visit: Payer: Self-pay

## 2022-02-18 ENCOUNTER — Emergency Department (HOSPITAL_BASED_OUTPATIENT_CLINIC_OR_DEPARTMENT_OTHER)
Admission: EM | Admit: 2022-02-18 | Discharge: 2022-02-19 | Disposition: A | Discharge status: Home / Self Care | Payer: Medicare Other | Attending: Emergency Medicine | Admitting: Emergency Medicine

## 2022-02-18 ENCOUNTER — Encounter (HOSPITAL_BASED_OUTPATIENT_CLINIC_OR_DEPARTMENT_OTHER): Payer: Self-pay | Admitting: *Deleted

## 2022-02-18 DIAGNOSIS — J45909 Unspecified asthma, uncomplicated: Secondary | ICD-10-CM | POA: Insufficient documentation

## 2022-02-18 DIAGNOSIS — N390 Urinary tract infection, site not specified: Secondary | ICD-10-CM

## 2022-02-18 DIAGNOSIS — R109 Unspecified abdominal pain: Secondary | ICD-10-CM | POA: Diagnosis present

## 2022-02-18 LAB — BASIC METABOLIC PANEL
Anion gap: 8 (ref 5–15)
BUN: 15 mg/dL (ref 6–20)
CO2: 29 mmol/L (ref 22–32)
Calcium: 8.7 mg/dL — ABNORMAL LOW (ref 8.9–10.3)
Chloride: 102 mmol/L (ref 98–111)
Creatinine, Ser: 1.37 mg/dL — ABNORMAL HIGH (ref 0.61–1.24)
GFR, Estimated: >60 mL/min (ref >60)
Glucose, Bld: 97 mg/dL (ref 70–99)
Potassium: 4 mmol/L (ref 3.5–5.1)
Sodium: 139 mmol/L (ref 135–145)
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
## 2022-02-18 NOTE — ED Triage Notes (Signed)
Poss UTI. He self caths due to paraplegic.  ?

## 2022-02-18 NOTE — ED Notes (Signed)
Unable to obtain IV access x1 attempt.  Serum labs drawn, but lavender tube hemolyzed.  Robin, EMT=P to bedside to attempt redraw.  Patient able to self-catheterize using ED provided kit, and urine specimen sent to lab for analysis.  ?

## 2022-02-19 ENCOUNTER — Emergency Department (HOSPITAL_BASED_OUTPATIENT_CLINIC_OR_DEPARTMENT_OTHER): Payer: Medicare Other

## 2022-02-19 DIAGNOSIS — N390 Urinary tract infection, site not specified: Secondary | ICD-10-CM | POA: Diagnosis not present

## 2022-02-19 LAB — URINALYSIS, ROUTINE W REFLEX MICROSCOPIC
Bilirubin Urine: NEGATIVE
Glucose, UA: NEGATIVE mg/dL
Ketones, ur: NEGATIVE mg/dL
Nitrite: POSITIVE — AB
Protein, ur: NEGATIVE mg/dL
Specific Gravity, Urine: >=1.03 (ref 1.005–1.030)
pH: 6 (ref 5.0–8.0)

## 2022-02-19 LAB — URINALYSIS, MICROSCOPIC (REFLEX)

## 2022-02-19 MED ORDER — CEFPODOXIME PROXETIL 200 MG PO TABS: 200.0000 mg | ORAL_TABLET | Freq: Two times a day (BID) | ORAL | 0 refills | Status: DC

## 2022-02-19 MED ORDER — CEPHALEXIN 250 MG PO CAPS
1000.0000 mg | ORAL_CAPSULE | Freq: Once | ORAL | Status: AC
  Administered 2022-02-19: 1000 mg via ORAL
  Filled 2022-02-19: qty 4

## 2022-02-19 NOTE — ED Notes (Signed)
Patient discharged to home.  All discharge instructions reviewed.  Patient verbalized understanding via teachback method.  VS WDL.  Respirations even and unlabored.  Wheelchair out of ED.   ?

## 2022-02-19 NOTE — ED Notes (Signed)
Patient returned from CT

## 2022-02-22 LAB — URINE CULTURE: Culture: >=100000 — AB

## 2022-02-23 ENCOUNTER — Telehealth (HOSPITAL_BASED_OUTPATIENT_CLINIC_OR_DEPARTMENT_OTHER): Payer: Self-pay | Admitting: *Deleted

## 2022-02-23 NOTE — Telephone Encounter (Signed)
Post ED Visit - Positive Culture Follow-up ? ?Culture report reviewed by antimicrobial stewardship pharmacist: ?[facility] ?[]  , Pharm.D. ?[]  Enzo Bi, Pharm.D., BCPS AQ-ID ?[]  , Pharm.D., BCPS ?[]  Celedonio Miyamoto, Pharm.D., BCPS ?[]  [place], Garvin Fila.D., BCPS, AAHIVP ?[]  , Pharm.D., BCPS, AAHIVP ?[]  Georgina Pillion, PharmD, BCPS ?[]  , PharmD, BCPS ?[]  [place], PharmD, BCPS ?[]  [address], PharmD ?[]  , PharmD, BCPS ?[x]  [name], PharmD ? ? [facility] ?[]  Lysle Pearl, PharmD ?[]  , PharmD ?[]  Phillips Climes, PharmD ?[]  , Rph ?[]  Agapito Games) , PharmD ?[]  Verlan Friends, PharmD ?[]  , PharmD ?[]  Mervyn Gay, PharmD ?[]  , PharmD ?[]  Delmar Landau, PharmD ?[]  Gerri Spore, PharmD ?[]  , PharmD ?[]  Len Childs, PharmD ? ? ?Positive urine culture ?Treated with Cefpodoxime Proxetil, organism sensitive to the same and no further patient follow-up is required at this time. ? ? ?02/23/2022, 3:26 PM ?  ?

## 2022-10-30 ENCOUNTER — Emergency Department (HOSPITAL_BASED_OUTPATIENT_CLINIC_OR_DEPARTMENT_OTHER)
Admission: EM | Admit: 2022-10-30 | Discharge: 2022-10-30 | Disposition: A | Discharge status: Home / Self Care | Payer: Medicare Other | Attending: Emergency Medicine | Admitting: Emergency Medicine

## 2022-10-30 ENCOUNTER — Other Ambulatory Visit: Payer: Self-pay

## 2022-10-30 DIAGNOSIS — N390 Urinary tract infection, site not specified: Secondary | ICD-10-CM | POA: Insufficient documentation

## 2022-10-30 DIAGNOSIS — R350 Frequency of micturition: Secondary | ICD-10-CM | POA: Diagnosis present

## 2022-10-30 LAB — URINALYSIS, ROUTINE W REFLEX MICROSCOPIC
Bilirubin Urine: NEGATIVE
Glucose, UA: NEGATIVE mg/dL
Ketones, ur: NEGATIVE mg/dL
Nitrite: POSITIVE — AB
Protein, ur: 30 mg/dL — AB
Specific Gravity, Urine: >=1.03 (ref 1.005–1.030)
pH: 6 (ref 5.0–8.0)

## 2022-10-30 LAB — URINALYSIS, MICROSCOPIC (REFLEX)

## 2022-10-30 MED ORDER — CIPROFLOXACIN HCL 500 MG PO TABS
500.0000 mg | ORAL_TABLET | Freq: Once | ORAL | Status: AC
  Administered 2022-10-30: 500 mg via ORAL
  Filled 2022-10-30: qty 1

## 2022-10-30 MED ORDER — CIPROFLOXACIN HCL 500 MG PO TABS: 500.0000 mg | ORAL_TABLET | Freq: Two times a day (BID) | ORAL | 0 refills | Status: AC

## 2022-10-30 NOTE — ED Triage Notes (Signed)
Pt complains of urinary leakage in between self caths and a foul odor to urine for 3 days. Denies any pain or burning. Denies any discharge.

## 2022-10-30 NOTE — ED Notes (Signed)
Pt self cathed himself without difficulty.   Urine sent to the lab
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
## 2022-10-30 NOTE — ED Triage Notes (Signed)
Pt also complains of right side pain for past 2 days.

## 2022-10-30 NOTE — Discharge Instructions (Signed)
Take Cipro twice a day for 2 weeks.  Continue self cath  See your doctor for follow-up  Return to ER if you have worse groin pain, trouble urinating, fever, vomiting

## 2022-11-01 LAB — URINE CULTURE: Culture: >=100000 — AB

## 2022-11-02 ENCOUNTER — Telehealth (HOSPITAL_BASED_OUTPATIENT_CLINIC_OR_DEPARTMENT_OTHER): Payer: Self-pay | Admitting: *Deleted

## 2022-11-02 NOTE — Telephone Encounter (Signed)
Post ED Visit - Positive Culture Follow-up  Culture report reviewed by antimicrobial stewardship pharmacist: [facility] []  , Pharm.D. []  Enzo Bi, .D., BCPS AQ-ID []  Celedonio Miyamoto, Pharm.D., BCPS []  [address], Pharm.D., BCPS []  [place], Garvin Fila.D., BCPS, AAHIVP []  , Pharm.D., BCPS, AAHIVP []  Georgina Pillion, PharmD, BCPS []  , PharmD, BCPS []  [place], PharmD, BCPS []  [address], PharmD []  , PharmD, BCPS [x]  [name], PharmD  Pharmacy Team []  Lysle Pearl, PharmD []  , PharmD []  Phillips Climes, PharmD []  , Rph []  Agapito Games) , PharmD []  Verlan Friends, PharmD []  , PharmD []  Mervyn Gay, PharmD []  , PharmD []  Comer Locket, PharmD []  [facility], PharmD []  , PharmD []  Len Childs, PharmD   Positive urine culture Treated with Ciprofloxacin, organism sensitive to the same and no further patient follow-up is required at this time.  11/02/2022, 2:04 PM

## 2023-01-13 ENCOUNTER — Emergency Department (HOSPITAL_BASED_OUTPATIENT_CLINIC_OR_DEPARTMENT_OTHER)
Admission: EM | Admit: 2023-01-13 | Discharge: 2023-01-13 | Disposition: A | Discharge status: Home / Self Care | Payer: 59 | Attending: Emergency Medicine | Admitting: Emergency Medicine

## 2023-01-13 ENCOUNTER — Encounter (HOSPITAL_BASED_OUTPATIENT_CLINIC_OR_DEPARTMENT_OTHER): Payer: Self-pay | Admitting: Emergency Medicine

## 2023-01-13 ENCOUNTER — Other Ambulatory Visit: Payer: Self-pay

## 2023-01-13 DIAGNOSIS — M79604 Pain in right leg: Secondary | ICD-10-CM | POA: Diagnosis present

## 2023-01-13 DIAGNOSIS — I10 Essential (primary) hypertension: Secondary | ICD-10-CM | POA: Diagnosis not present

## 2023-01-13 DIAGNOSIS — L03115 Cellulitis of right lower limb: Secondary | ICD-10-CM

## 2023-01-13 MED ORDER — DOXYCYCLINE HYCLATE 100 MG PO TABS
100.0000 mg | ORAL_TABLET | Freq: Once | ORAL | Status: AC
  Administered 2023-01-13: 100 mg via ORAL
  Filled 2023-01-13: qty 1

## 2023-01-13 MED ORDER — DOXYCYCLINE HYCLATE 100 MG PO CAPS: 100.0000 mg | ORAL_CAPSULE | Freq: Two times a day (BID) | ORAL | 0 refills | Status: DC

## 2023-01-13 NOTE — ED Provider Notes (Signed)
[facility] Provider Note   CSN: [id number] Arrival date & time: 01/13/23  1413     History  Chief Complaint  Patient presents with   Leg Pain    Mark Simmons is a 42 y.o. male.  80 yoM with a chief complaints of right leg fullness.  This been bothering him for about 4 to 5 days now.  Been seen at a different emergency department at the onset and had a DVT study that was negative and was encouraged to follow-up with his family doctor.  He says since then he has developed some subjective fevers and chills and has had some change to the skin coloration on the back of the right upper leg.  He thinks it feels warm at times.  Came here for evaluation.  He denies any trauma to the area denies break in the skin.   Leg Pain      Home Medications Prior to Admission medications   Medication Sig Start Date End Date Taking? Authorizing Provider  doxycycline (VIBRAMYCIN) 100 MG capsule Take 1 capsule (100 mg total) by mouth 2 (two) times daily. One po bid x 7 days 01/13/23  Yes Deno Etienne, DO  baclofen (LIORESAL) 10 MG tablet Take 10 mg by mouth 3 (three) times daily.    [provider]  cefpodoxime (VANTIN) 200 MG tablet Take 1 tablet (200 mg total) by mouth 2 (two) times daily. 02/19/22   Molpus, John, MD  oxybutynin (DITROPAN XL) 15 MG 24 hr tablet Take 15 mg by mouth 2 (two) times daily.    [provider]  Oxycodone HCl 20 MG TABS Take 1 tablet as needed by mouth.    [provider]      Allergies    Sulfa antibiotics    Review of Systems   Review of Systems  Physical Exam Updated Vital Signs BP (!) 150/78 (BP Location: Right Arm)   Pulse 91   Temp 97.7 F (36.5 C) (Oral)   Resp 18   SpO2 100%  Physical Exam Vitals and nursing note reviewed.  Constitutional:      Appearance: He is well-developed.  HENT:     Head: Normocephalic and atraumatic.  Eyes:     Pupils: Pupils are equal, round, and  reactive to light.  Neck:     Vascular: No JVD.  Cardiovascular:     Rate and Rhythm: Normal rate and regular rhythm.     Heart sounds: No murmur heard.    No friction rub. No gallop.  Pulmonary:     Effort: No respiratory distress.     Breath sounds: No wheezing.  Abdominal:     General: There is no distension.     Tenderness: There is no abdominal tenderness. There is no guarding or rebound.  Musculoskeletal:        General: Normal range of motion.     Cervical back: Normal range of motion and neck supple.     Comments: Bilateral right lower extremity paralysis.  There is some mild increased fullness to the right lower extremity compared to the left at the thigh, there is some hyperpigmentation of the skin to the posterior aspect of the leg.  No obvious warmth no fluctuance no induration.  Skin:    Coloration: Skin is not pale.     Findings: No rash.  Neurological:     Mental Status: He is alert and oriented to person, place, and time.  Psychiatric:  Behavior: Behavior normal.     ED Results / Procedures / Treatments   Labs (all labs ordered are listed, but only abnormal results are displayed) Labs Reviewed - No data to display  EKG None  Radiology No results found.  Procedures Procedures    Medications Ordered in ED Medications  doxycycline (VIBRA-TABS) tablet 100 mg (has no administration in time range)    ED Course/ Medical Decision Making/ A&P                             Medical Decision Making Risk Prescription drug management.   42 yo M with a chief complaint of right leg fullness.  This been going on for about 4 to 5 days now.  Was seen at the onset that he noticed it at an outside emergency department.  I reviewed that visit and his medical record, he had a DVT study that was negative, and a CK that was negative.  Was discharged home.  It sounds like his symptoms have evolved and now he is complaining more of subjective fevers and chills and  warmth and skin color change.  Not clinically consistent with cellulitis on exam.  Looks more consistent with a hematoma.  Will start on oral antibiotics with his history though.  Not sure that a repeat ultrasound at this point would be beneficial.  I did encourage him if he had ongoing swelling to discuss this with his family doctor and that may repeat it in a week or 2.  Have him follow-up with his family doctor in the office.  3:30 PM:  I have discussed the diagnosis/risks/treatment options with the patient.  Evaluation and diagnostic testing in the emergency department does not suggest an emergent condition requiring admission or immediate intervention beyond what has been performed at this time.  They will follow up with PCP. We also discussed returning to the ED immediately if new or worsening sx occur. We discussed the sx which are most concerning (e.g., sudden worsening pain, fever, inability to tolerate by mouth) that necessitate immediate return. Medications administered to the patient during their visit and any new prescriptions provided to the patient are listed below.  Medications given during this visit Medications  doxycycline (VIBRA-TABS) tablet 100 mg (has no administration in time range)     The patient appears reasonably screen and/or stabilized for discharge and I doubt any other medical condition or other [facility] requiring further screening, evaluation, or treatment in the ED at this time prior to discharge.          Final Clinical Impression(s) / ED Diagnoses Final diagnoses:  Cellulitis of right lower extremity    Rx / DC Orders ED Discharge Orders          Ordered    doxycycline (VIBRAMYCIN) 100 MG capsule  2 times daily        01/13/23 [place], Sidhant Helderman, DO 01/13/23 1530

## 2023-01-13 NOTE — Discharge Instructions (Signed)
Return for rapid spreading or measured fever.

## 2023-01-13 NOTE — ED Notes (Signed)
D/c paperwork reviewed with pt, including prescriptions and follow up care.  No questions or concerns voiced at time of d/c. Mark Simmons Pt verbalized understanding, pt in personal wheelchair to ED exit, NAD.
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
## 2023-01-20 ENCOUNTER — Emergency Department (HOSPITAL_BASED_OUTPATIENT_CLINIC_OR_DEPARTMENT_OTHER)
Admission: EM | Admit: 2023-01-20 | Discharge: 2023-01-21 | Disposition: A | Discharge status: Home / Self Care | Payer: 59 | Attending: Emergency Medicine | Admitting: Emergency Medicine

## 2023-01-20 ENCOUNTER — Encounter (HOSPITAL_BASED_OUTPATIENT_CLINIC_OR_DEPARTMENT_OTHER): Payer: Self-pay | Admitting: Emergency Medicine

## 2023-01-20 ENCOUNTER — Other Ambulatory Visit: Payer: Self-pay

## 2023-01-20 DIAGNOSIS — W3400XA Accidental discharge from unspecified firearms or gun, initial encounter: Secondary | ICD-10-CM | POA: Insufficient documentation

## 2023-01-20 DIAGNOSIS — S79921A Unspecified injury of right thigh, initial encounter: Secondary | ICD-10-CM | POA: Diagnosis present

## 2023-01-20 DIAGNOSIS — S7011XA Contusion of right thigh, initial encounter: Secondary | ICD-10-CM | POA: Insufficient documentation

## 2023-01-20 LAB — URINALYSIS, MICROSCOPIC (REFLEX)

## 2023-01-20 LAB — URINALYSIS, ROUTINE W REFLEX MICROSCOPIC
Bilirubin Urine: NEGATIVE
Glucose, UA: NEGATIVE mg/dL
Ketones, ur: NEGATIVE mg/dL
Leukocytes,Ua: NEGATIVE
Nitrite: NEGATIVE
Protein, ur: NEGATIVE mg/dL
Specific Gravity, Urine: 1.01 (ref 1.005–1.030)
pH: 6.5 (ref 5.0–8.0)

## 2023-01-20 NOTE — ED Triage Notes (Signed)
Pt states he wants to be checked for a UTI and also wants his right leg rechecked  Pt states he was seen here on the 12th for his leg and was given on antibiotics  Pt states he thinks his leg is worse  Pt states the right inner thigh is red, hard, hot to touch and aching really bad

## 2023-01-21 MED ORDER — DOXYCYCLINE HYCLATE 100 MG PO CAPS: 100.0000 mg | ORAL_CAPSULE | Freq: Two times a day (BID) | ORAL | 0 refills | Status: AC
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
## 2023-01-21 NOTE — Discharge Instructions (Signed)
Your urinalysis is clear and shows no evidence for infection.  Continue doxycycline.  This medication has been reordered for you.  Return to the emergency department if you develop high fever, or other new and concerning symptoms.

## 2023-01-21 NOTE — ED Notes (Signed)
Pt states he wanted to re-evaluated to assess rt. Leg.  Pt has been seen for this and work-up has been negative so far.  Pt was last given Doxy, rt. Upper inner thigh appears bruised and slightly more swollen than left.  No redness or warmth to leg.  Pt is afebrile  Pt also wanted his urine checked for UTI, pt self-caths and has been leaking in between schedule times to cath.  Mother at bedside

## 2023-02-03 IMAGING — CT CT RENAL STONE PROTOCOL
2 of 4 series · 16 of 46 positions shown, 18 images · non-contrast
Comparison: None.

CLINICAL DATA: Flank pain



[Series 2: axial st · axial · 0.78mm/px · z∈[+781,+1181]mm · 13 of 88 slices shown, 15 images]
[im 4/88  soft-tissue]
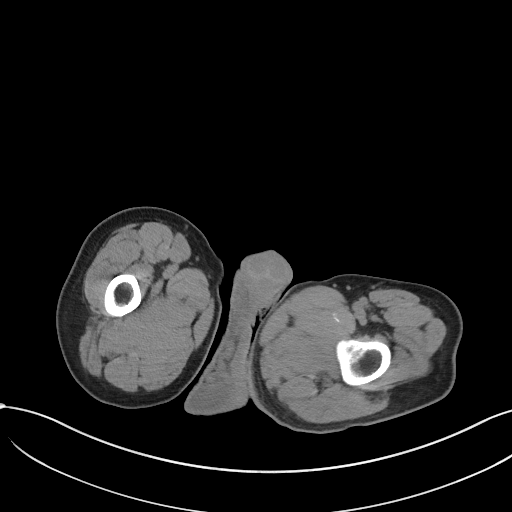
[im 4/88  bone]
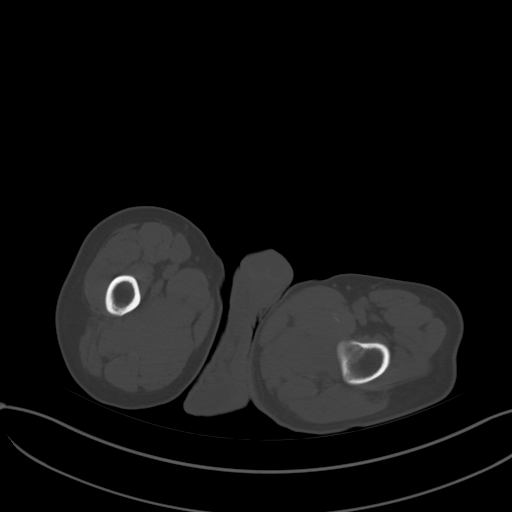
[im 11/88  soft-tissue]
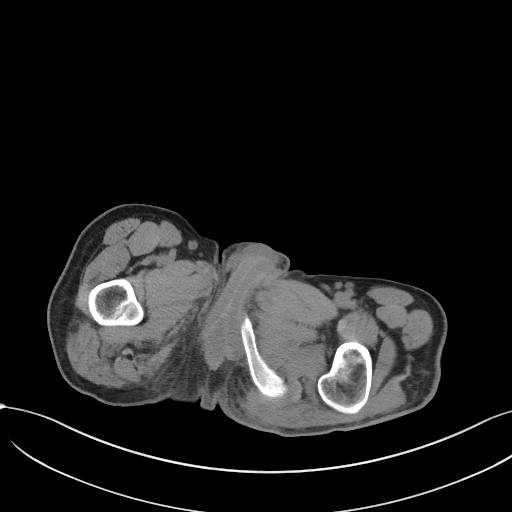
[im 18/88  soft-tissue]
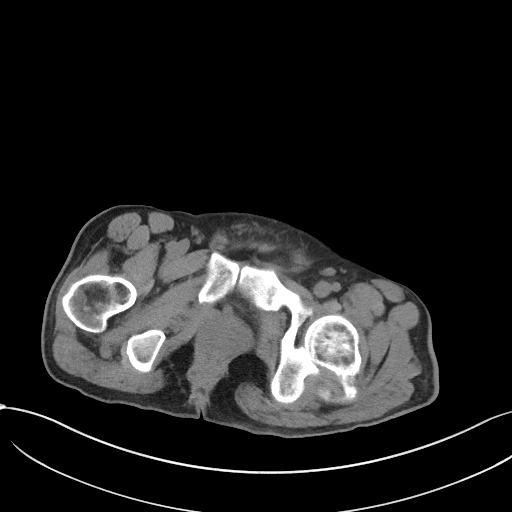
[im 25/88  soft-tissue]
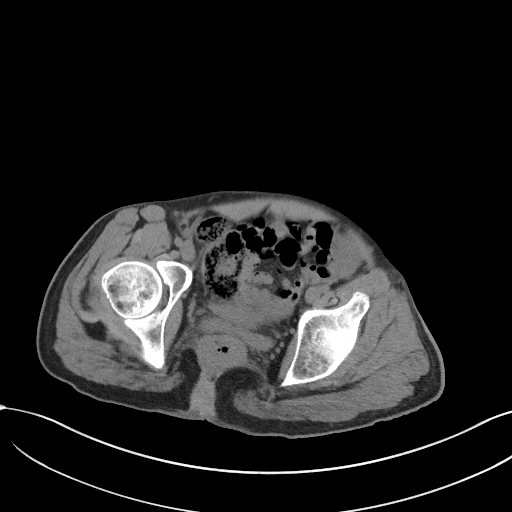
[im 32/88  soft-tissue]
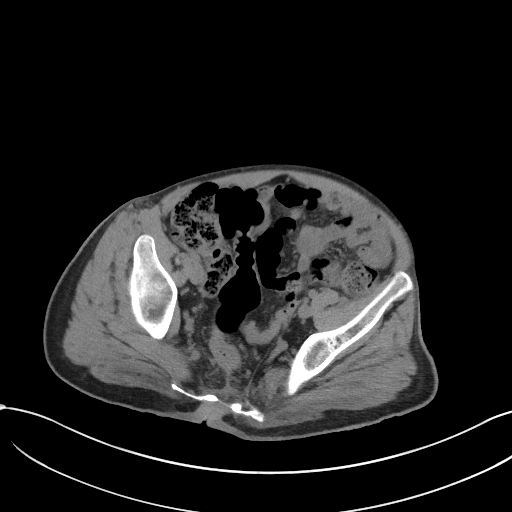
[im 39/88  soft-tissue]
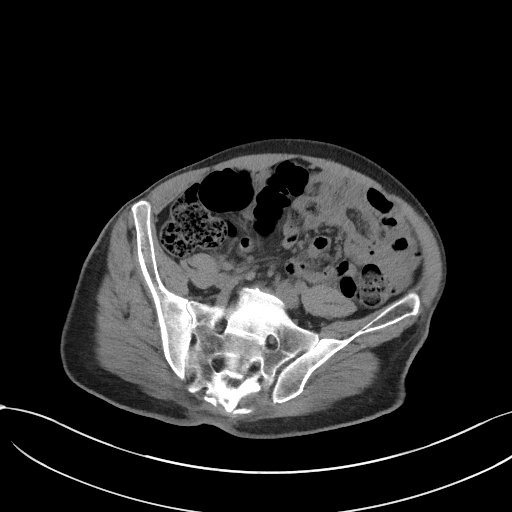
[im 46/88  soft-tissue]
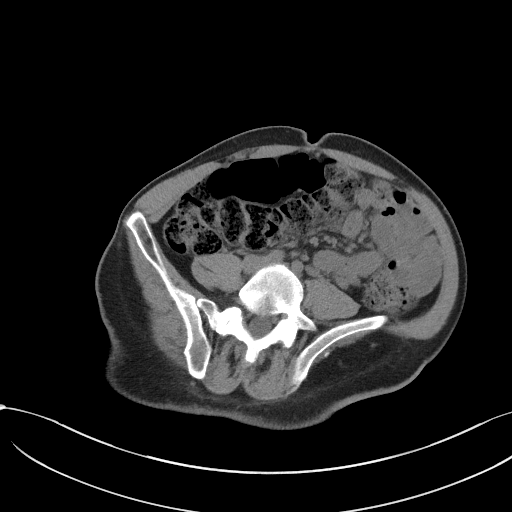
[im 49/88  soft-tissue]
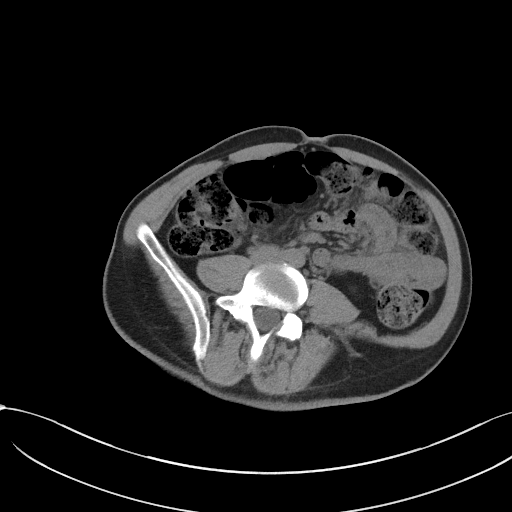
[im 56/88  soft-tissue]
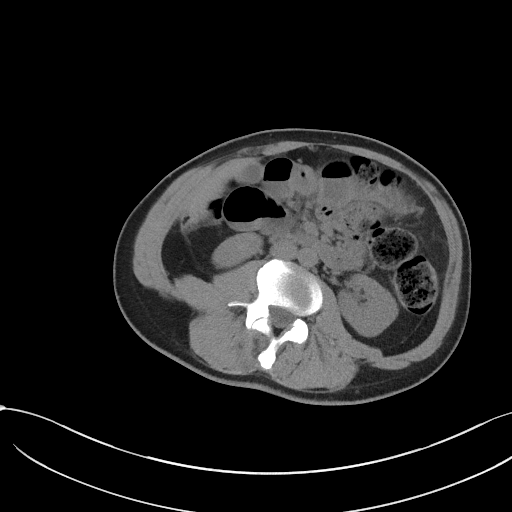
[im 56/88  bone]
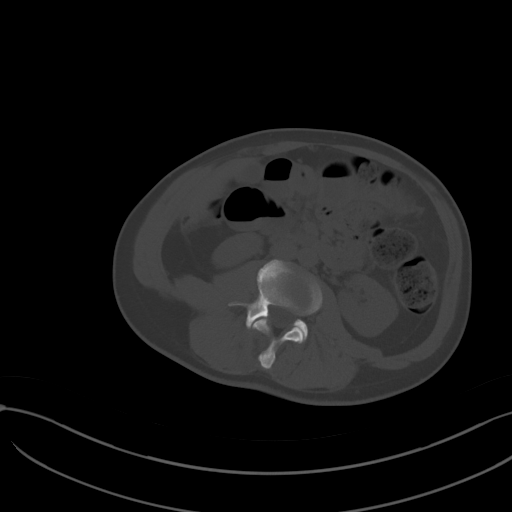
[im 63/88  soft-tissue]
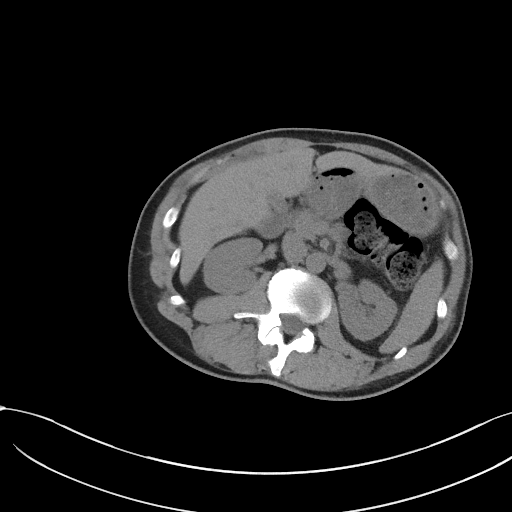
[im 70/88  soft-tissue]
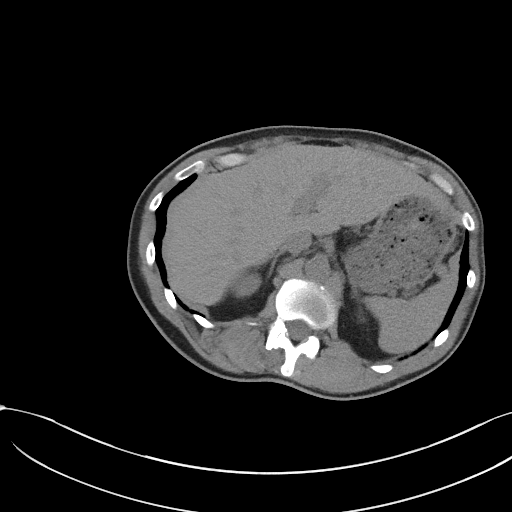
[im 77/88  soft-tissue]
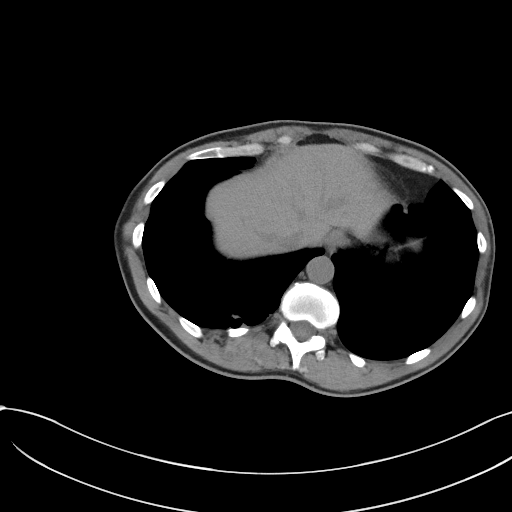
[im 84/88  soft-tissue]
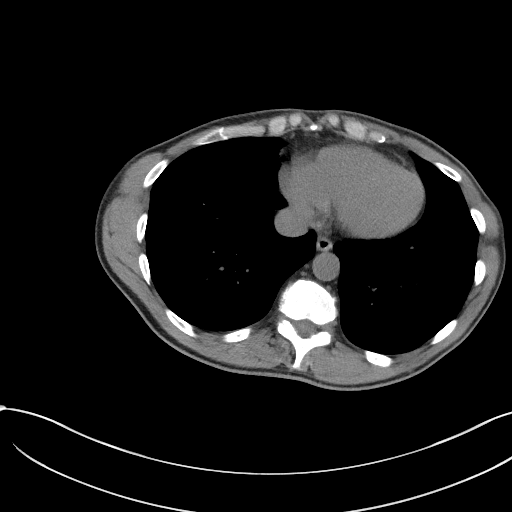

[Series 5: coronal st · coronal · 0.70mm/px · 3 of 85 slices shown]
[im 29/85  soft-tissue]
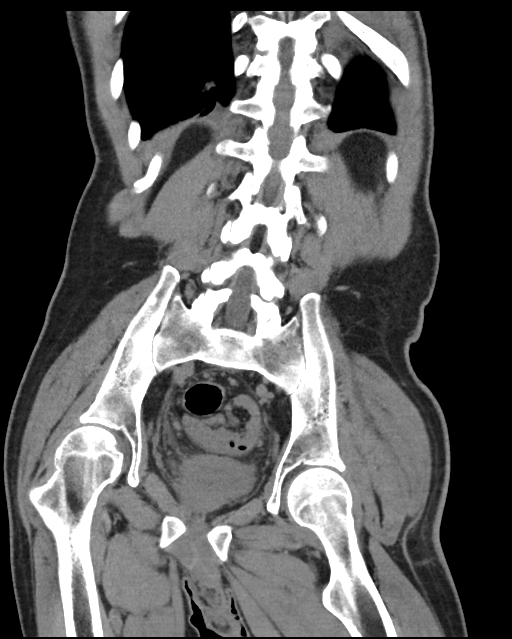
[im 38/85  soft-tissue]
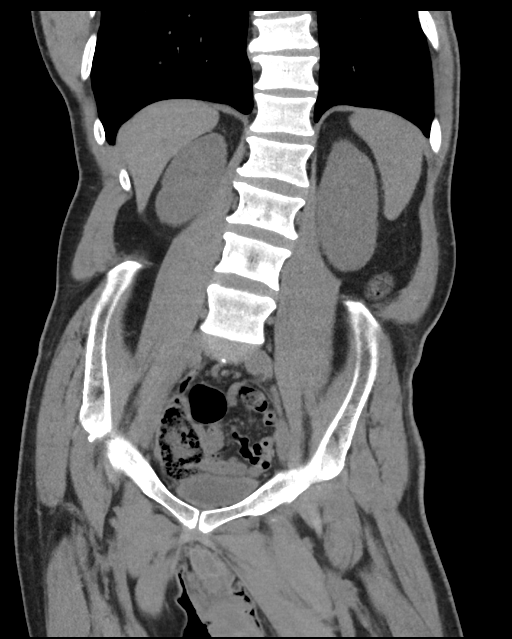
[im 47/85  soft-tissue]
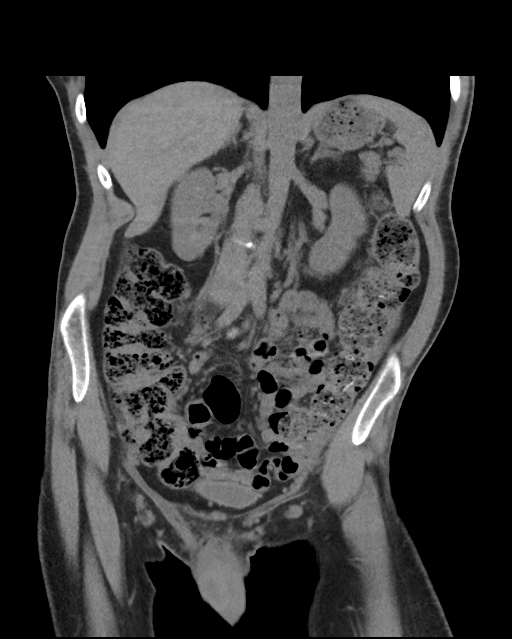

[16 of 46 positions shown; findings below may reference images not displayed]

FINDINGS: Lower Chest: Normal.

Hepatobiliary: Normal hepatic contours. No intra- or extrahepatic
biliary dilatation. The gallbladder is normal.

Pancreas: Normal pancreas. No ductal dilatation or peripancreatic
fluid collection.

Spleen: Normal.

Adrenals/Urinary Tract: The adrenal glands are normal.
Nonobstructive right nephrolithiasis measuring up to 3 mm. The
urinary bladder is normal for degree of distention

Stomach/Bowel: There is no hiatal hernia. Normal duodenal course and
caliber. No small bowel dilatation or inflammation. No focal colonic
abnormality. Normal appendix.

Vascular/Lymphatic: Normal course and caliber of the major abdominal
vessels. No abdominal or pelvic lymphadenopathy.

Reproductive: Normal prostate size with symmetric seminal vesicles.

Other: None.

Musculoskeletal: No bony spinal canal stenosis or focal osseous
abnormality.
IMPRESSION: 1. No acute abnormality of the abdomen or pelvis.
2. Nonobstructive right nephrolithiasis measuring up to 3 mm.

## 2023-07-21 ENCOUNTER — Encounter (HOSPITAL_BASED_OUTPATIENT_CLINIC_OR_DEPARTMENT_OTHER): Payer: Self-pay | Admitting: Emergency Medicine

## 2023-07-21 ENCOUNTER — Other Ambulatory Visit: Payer: Self-pay

## 2023-07-21 DIAGNOSIS — J45909 Unspecified asthma, uncomplicated: Secondary | ICD-10-CM | POA: Diagnosis not present

## 2023-07-21 DIAGNOSIS — N3000 Acute cystitis without hematuria: Secondary | ICD-10-CM | POA: Diagnosis not present

## 2023-07-21 DIAGNOSIS — R102 Pelvic and perineal pain: Secondary | ICD-10-CM | POA: Diagnosis present

## 2023-07-21 NOTE — ED Triage Notes (Signed)
Patient arrived via POV c/o recurrent UTI x 2 days. Patient is paraplegic with hx of same. Patient is AO x 4, VS WDL, in wheelchair.

## 2023-07-22 ENCOUNTER — Emergency Department (HOSPITAL_BASED_OUTPATIENT_CLINIC_OR_DEPARTMENT_OTHER)
Admission: EM | Admit: 2023-07-22 | Discharge: 2023-07-22 | Disposition: A | Discharge status: Home / Self Care | Payer: 59 | Attending: Emergency Medicine | Admitting: Emergency Medicine

## 2023-07-22 DIAGNOSIS — N3000 Acute cystitis without hematuria: Secondary | ICD-10-CM

## 2023-07-22 LAB — URINALYSIS, ROUTINE W REFLEX MICROSCOPIC
Bilirubin Urine: NEGATIVE
Glucose, UA: NEGATIVE mg/dL
Hgb urine dipstick: NEGATIVE
Ketones, ur: NEGATIVE mg/dL
Nitrite: NEGATIVE
Protein, ur: NEGATIVE mg/dL
Specific Gravity, Urine: 1.015 (ref 1.005–1.030)
pH: 7 (ref 5.0–8.0)

## 2023-07-22 LAB — URINALYSIS, MICROSCOPIC (REFLEX)

## 2023-07-22 MED ORDER — CEPHALEXIN 500 MG PO CAPS: 500.0000 mg | ORAL_CAPSULE | Freq: Two times a day (BID) | ORAL | 0 refills | Status: DC

## 2023-07-22 MED ORDER — CEFTRIAXONE SODIUM 1 G IJ SOLR
1.0000 g | Freq: Once | INTRAMUSCULAR | Status: AC
  Administered 2023-07-22: 1 g via INTRAMUSCULAR
  Filled 2023-07-22: qty 10
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
## 2023-08-13 ENCOUNTER — Encounter (HOSPITAL_BASED_OUTPATIENT_CLINIC_OR_DEPARTMENT_OTHER): Payer: Self-pay

## 2023-08-13 ENCOUNTER — Emergency Department (HOSPITAL_BASED_OUTPATIENT_CLINIC_OR_DEPARTMENT_OTHER)
Admission: EM | Admit: 2023-08-13 | Discharge: 2023-08-13 | Disposition: A | Discharge status: Home / Self Care | Payer: 59 | Attending: Emergency Medicine | Admitting: Emergency Medicine

## 2023-08-13 ENCOUNTER — Other Ambulatory Visit: Payer: Self-pay

## 2023-08-13 DIAGNOSIS — N3090 Cystitis, unspecified without hematuria: Secondary | ICD-10-CM | POA: Insufficient documentation

## 2023-08-13 DIAGNOSIS — R350 Frequency of micturition: Secondary | ICD-10-CM | POA: Diagnosis present

## 2023-08-13 LAB — CBC
HCT: 38.6 % — ABNORMAL LOW (ref 39.0–52.0)
Hemoglobin: 13.4 g/dL (ref 13.0–17.0)
MCH: 27.5 pg (ref 26.0–34.0)
MCHC: 34.7 g/dL (ref 30.0–36.0)
MCV: 79.1 fL — ABNORMAL LOW (ref 80.0–100.0)
Platelets: 162 10*3/uL (ref 150–400)
RBC: 4.88 MIL/uL (ref 4.22–5.81)
RDW: 14 % (ref 11.5–15.5)
WBC: 4.3 10*3/uL (ref 4.0–10.5)
nRBC: 0 % (ref 0.0–0.2)

## 2023-08-13 LAB — BASIC METABOLIC PANEL
Anion gap: 7 (ref 5–15)
BUN: 16 mg/dL (ref 6–20)
CO2: 28 mmol/L (ref 22–32)
Calcium: 8.7 mg/dL — ABNORMAL LOW (ref 8.9–10.3)
Chloride: 104 mmol/L (ref 98–111)
Creatinine, Ser: 0.97 mg/dL (ref 0.61–1.24)
GFR, Estimated: >60 mL/min (ref >60)
Glucose, Bld: 110 mg/dL — ABNORMAL HIGH (ref 70–99)
Potassium: 4.4 mmol/L (ref 3.5–5.1)
Sodium: 139 mmol/L (ref 135–145)

## 2023-08-13 LAB — URINALYSIS, ROUTINE W REFLEX MICROSCOPIC
Bilirubin Urine: NEGATIVE
Glucose, UA: NEGATIVE mg/dL
Hgb urine dipstick: NEGATIVE
Ketones, ur: NEGATIVE mg/dL
Nitrite: NEGATIVE
Protein, ur: NEGATIVE mg/dL
Specific Gravity, Urine: 1.02 (ref 1.005–1.030)
pH: 7.5 (ref 5.0–8.0)

## 2023-08-13 LAB — URINALYSIS, MICROSCOPIC (REFLEX)

## 2023-08-13 MED ORDER — CEFDINIR 300 MG PO CAPS: 300.0000 mg | ORAL_CAPSULE | Freq: Two times a day (BID) | ORAL | 0 refills | Status: AC

## 2023-08-13 NOTE — Discharge Instructions (Addendum)
Please take Omnicef (cefdinir) two times a day. Follow up with your primary care provider and urologist. You will be called with the results of the urine culture. If you have fevers, chills, or new back pain, please return to the ED.
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
## 2023-08-13 NOTE — ED Triage Notes (Signed)
Patient reports spasms coming from his bladder and urinary frequency. Patient states he was being treated for a UTI 3 weeks ago.

## 2023-08-15 LAB — URINE CULTURE: Culture: >=100000 — AB

## 2023-08-16 ENCOUNTER — Telehealth (HOSPITAL_BASED_OUTPATIENT_CLINIC_OR_DEPARTMENT_OTHER): Payer: Self-pay | Admitting: *Deleted

## 2023-08-16 NOTE — Telephone Encounter (Signed)
Post ED Visit - Positive Culture Follow-up: Unsuccessful Patient Follow-up  Culture assessed and recommendations reviewed by:  [x]  Delmar Landau, Pharm.D. []  Celedonio Miyamoto, Pharm.D., BCPS AQ-ID []  Garvin Fila, Pharm.D., BCPS []  Georgina Pillion, [address].D., BCPS []  [place], [address].D., BCPS, AAHIVP []  [name], Pharm.D., BCPS, AAHIVP []  Sherlynn Carbon, PharmD []  Pollyann Samples, PharmD, BCPS  Positive urine culture  []  Patient discharged without antimicrobial prescription and treatment is now indicated [x]  Organism is resistant to prescribed ED discharge antimicrobial []  Patient with positive blood cultures  Plan:  Stop Cefdinir:  Start Macrobid 100mg  PO BID x 7 days, Lurena Nida, PA-C   Unable to contact patient after 3 attempts, letter will be sent to address on file  Lysle Pearl 08/16/2023, 1:08 PM

## 2023-08-16 NOTE — Progress Notes (Addendum)
ED Antimicrobial Stewardship Positive Culture Follow Up   Mark Simmons is an 42 y.o. male who presented to [facility] on 08/13/2023 with a chief complaint of  Chief Complaint  Patient presents with   Urinary Frequency    Recent Results (from the past 720 hour(s))  Urine Culture     Status: Abnormal   Collection Time: 08/13/23  9:32 PM   Specimen: Urine, Catheterized  Result Value Ref Range Status   Specimen Description   Final    URINE, CATHETERIZED Performed at [facility], [address] [place], [place] [address]    Special Requests   Final    NONE Performed at [facility], [address] [place], [place] [address]    Culture >=100,000 COLONIES/mL STAPHYLOCOCCUS HAEMOLYTICUS (A)  Final   Report Status 08/15/2023 FINAL  Final   Organism ID, Bacteria STAPHYLOCOCCUS HAEMOLYTICUS (A)  Final      Susceptibility   Staphylococcus haemolyticus - MIC*    CIPROFLOXACIN >=8 RESISTANT Resistant     GENTAMICIN >=16 RESISTANT Resistant     NITROFURANTOIN <=16 SENSITIVE Sensitive     OXACILLIN >=4 RESISTANT Resistant     TETRACYCLINE <=1 SENSITIVE Sensitive     VANCOMYCIN 1 SENSITIVE Sensitive     TRIMETH/SULFA >=320 RESISTANT Resistant     CLINDAMYCIN <=0.25 SENSITIVE Sensitive     RIFAMPIN <=0.5 SENSITIVE Sensitive     Inducible Clindamycin NEGATIVE Sensitive     * >=100,000 COLONIES/mL STAPHYLOCOCCUS HAEMOLYTICUS    [x]  Patient with recent prescriptions for keflex and macrobid for UTI. Patient prescribed either cefdinir or Bactrim (notes in EPIC are conflicting and I only see cefdinir prescribed). Either way, culture above is resistant.  Could consider PO linezolid, but given findings and that patient is supposed to f/u with urology will opt for treatment with nitrofurantoin again at this time.  STOP current antibiotic START Nitrofurantoin (Macrobid) 100 mg BID x 7 days (Qty 14; Refills 0) Patient needs to f/u is urology  ED Provider: Lurena Nida PA   Delmar Landau, PharmD, BCPS 08/16/2023 10:22 AM ED Clinical Pharmacist -  [phone]

## 2023-08-22 ENCOUNTER — Telehealth (HOSPITAL_BASED_OUTPATIENT_CLINIC_OR_DEPARTMENT_OTHER): Payer: Self-pay | Admitting: *Deleted

## 2023-08-22 NOTE — Telephone Encounter (Signed)
Spoke with patient in response to letter sent to address on file.  States he has completed Cefinir but continues to be symptomatic.  Macrobid 100mg  PO BID x 7 days called to [facility] [address] [phone] and advised to follow up with Urology.

## 2024-02-21 ENCOUNTER — Other Ambulatory Visit: Payer: Self-pay

## 2024-02-21 ENCOUNTER — Encounter (HOSPITAL_BASED_OUTPATIENT_CLINIC_OR_DEPARTMENT_OTHER): Payer: Self-pay

## 2024-02-21 ENCOUNTER — Emergency Department (HOSPITAL_BASED_OUTPATIENT_CLINIC_OR_DEPARTMENT_OTHER)
Admission: EM | Admit: 2024-02-21 | Discharge: 2024-02-21 | Disposition: A | Discharge status: Home / Self Care | Attending: Emergency Medicine | Admitting: Emergency Medicine

## 2024-02-21 DIAGNOSIS — R39198 Other difficulties with micturition: Secondary | ICD-10-CM | POA: Insufficient documentation

## 2024-02-21 DIAGNOSIS — J45909 Unspecified asthma, uncomplicated: Secondary | ICD-10-CM | POA: Insufficient documentation

## 2024-02-21 DIAGNOSIS — R300 Dysuria: Secondary | ICD-10-CM | POA: Diagnosis present

## 2024-02-21 LAB — URINALYSIS, W/ REFLEX TO CULTURE (INFECTION SUSPECTED)
Bilirubin Urine: NEGATIVE
Glucose, UA: NEGATIVE mg/dL
Ketones, ur: NEGATIVE mg/dL
Nitrite: NEGATIVE
Protein, ur: NEGATIVE mg/dL
Specific Gravity, Urine: 1.025 (ref 1.005–1.030)
pH: 6 (ref 5.0–8.0)

## 2024-02-21 MED ORDER — CEPHALEXIN 250 MG PO CAPS
500.0000 mg | ORAL_CAPSULE | Freq: Once | ORAL | Status: AC
  Administered 2024-02-21: 500 mg via ORAL
  Filled 2024-02-21: qty 2

## 2024-02-21 MED ORDER — CEPHALEXIN 500 MG PO CAPS: 500.0000 mg | ORAL_CAPSULE | Freq: Three times a day (TID) | ORAL | 0 refills | Status: AC

## 2024-02-21 NOTE — ED Notes (Signed)
 Called lab to add culture to urine previously sent to lab
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
## 2024-02-21 NOTE — Discharge Instructions (Addendum)
 It was a pleasure caring for you today in the emergency department.  Please follow up with your pcp for recheck  Please return to the emergency department for any worsening or worrisome symptoms.
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
## 2024-02-23 LAB — URINE CULTURE: Culture: >=100000 — AB

## 2024-02-24 ENCOUNTER — Telehealth (HOSPITAL_BASED_OUTPATIENT_CLINIC_OR_DEPARTMENT_OTHER): Payer: Self-pay

## 2024-02-24 NOTE — Telephone Encounter (Addendum)
 Post ED Visit - Positive Culture Follow-up: Chart Hand-off to ED Flow Manager  Culture assessed and recommendations reviewed by: []  Isaac Bliss, Pharm.D., BCPS []  Celedonio Miyamoto, Pharm.D., BCPS-AQ ID []  Georgina Pillion, Pharm.D., BCPS []  [place], [address].D., BCPS, AAHIVP []  [name], Pharm .D., BCPS, AAHIVP []  Tennis Must, Pharm.D. []  Casilda Carls, Pharm.DLevie Heritage, PharmD  Positive urine culture  Treated with Cephalexin  Changes discussed with ED provider: Anders Simmonds, DO "*Symptom check   *If symptomatic,   *Start doxycyline 100 mg BID x 5 days    *Stop cefdinir"  02/24/24 @ 9:11 LVM requesting callback.  Letter sent to [facility] address.  02/24/24 @ 12:04 Spoke to pt still having symptoms.  Informed of dx, need for addl and to stop cefdinir.  Rx for Amoxicillin called to [facility] on [address] in [place] @ [phone] and given to pharmacist per pt request.   Arvid Right 02/24/2024, 8:59 AM

## 2024-02-24 NOTE — Progress Notes (Signed)
 ED Antimicrobial Stewardship Positive Culture Follow Up   Mark Simmons is an 43 y.o. male who presented to [facility] on 02/21/2024 with a chief complaint of  Chief Complaint  Patient presents with   Dysuria    Recent Results (from the past 720 hours)  Urine Culture     Status: Abnormal   Collection Time: 02/21/24  3:25 AM   Specimen: Urine, Clean Catch  Result Value Ref Range Status   Specimen Description   Final    URINE, CLEAN CATCH Performed at [facility], [address] [place], [place] [address]    Special Requests   Final    NONE Performed at [facility], [address] [place], [place] [address]    Culture >=100,000 COLONIES/mL STAPHYLOCOCCUS EPIDERMIDIS (A)  Final   Report Status 02/23/2024 FINAL  Final   Organism ID, Bacteria STAPHYLOCOCCUS EPIDERMIDIS (A)  Final      Susceptibility   Staphylococcus epidermidis - MIC*    CIPROFLOXACIN <=0.5 SENSITIVE Sensitive     GENTAMICIN <=0.5 SENSITIVE Sensitive     NITROFURANTOIN <=16 SENSITIVE Sensitive     OXACILLIN >=4 RESISTANT Resistant     TETRACYCLINE 2 SENSITIVE Sensitive     VANCOMYCIN 1 SENSITIVE Sensitive     TRIMETH/SULFA 160 RESISTANT Resistant     RIFAMPIN <=0.5 SENSITIVE Sensitive     Inducible Clindamycin NEGATIVE Sensitive     * >=100,000 COLONIES/mL STAPHYLOCOCCUS EPIDERMIDIS    [x]  Treated with cephalexin, organism resistant to prescribed antimicrobial []  Patient discharged originally without antimicrobial agent and treatment is now indicated  If still symptomatic will start new antibiotic as below.  New antibiotic prescription: doxycycline 500 mg BID for 5 days  ED Provider: Anders Simmonds, DO   Mark Simmons 02/24/2024, 7:57 AM Clinical Pharmacist Monday - Friday phone -  [phone] Saturday - Sunday phone - [phone]

## 2024-05-24 ENCOUNTER — Encounter (HOSPITAL_BASED_OUTPATIENT_CLINIC_OR_DEPARTMENT_OTHER): Payer: Self-pay

## 2024-05-24 ENCOUNTER — Other Ambulatory Visit: Payer: Self-pay

## 2024-05-24 ENCOUNTER — Emergency Department (HOSPITAL_BASED_OUTPATIENT_CLINIC_OR_DEPARTMENT_OTHER)
Admission: EM | Admit: 2024-05-24 | Discharge: 2024-05-24 | Disposition: A | Discharge status: Home / Self Care | Attending: Emergency Medicine | Admitting: Emergency Medicine

## 2024-05-24 DIAGNOSIS — R8290 Unspecified abnormal findings in urine: Secondary | ICD-10-CM | POA: Insufficient documentation

## 2024-05-24 DIAGNOSIS — R300 Dysuria: Secondary | ICD-10-CM | POA: Diagnosis present

## 2024-05-24 LAB — URINALYSIS, ROUTINE W REFLEX MICROSCOPIC
Bilirubin Urine: NEGATIVE
Glucose, UA: NEGATIVE mg/dL
Hgb urine dipstick: NEGATIVE
Ketones, ur: NEGATIVE mg/dL
Leukocytes,Ua: NEGATIVE
Nitrite: NEGATIVE
Protein, ur: NEGATIVE mg/dL
Specific Gravity, Urine: 1.025 (ref 1.005–1.030)
pH: 6.5 (ref 5.0–8.0)

## 2024-05-24 LAB — CBC
HCT: 40.3 % (ref 39.0–52.0)
Hemoglobin: 14.4 g/dL (ref 13.0–17.0)
MCH: 27.9 pg (ref 26.0–34.0)
MCHC: 35.7 g/dL (ref 30.0–36.0)
MCV: 77.9 fL — ABNORMAL LOW (ref 80.0–100.0)
Platelets: 163 10*3/uL (ref 150–400)
RBC: 5.17 MIL/uL (ref 4.22–5.81)
RDW: 13.7 % (ref 11.5–15.5)
WBC: 5 10*3/uL (ref 4.0–10.5)
nRBC: 0 % (ref 0.0–0.2)

## 2024-05-24 LAB — BASIC METABOLIC PANEL WITH GFR
Anion gap: 10 (ref 5–15)
BUN: 22 mg/dL — ABNORMAL HIGH (ref 6–20)
CO2: 26 mmol/L (ref 22–32)
Calcium: 9 mg/dL (ref 8.9–10.3)
Chloride: 105 mmol/L (ref 98–111)
Creatinine, Ser: 1.03 mg/dL (ref 0.61–1.24)
GFR, Estimated: >60 mL/min (ref >60)
Glucose, Bld: 105 mg/dL — ABNORMAL HIGH (ref 70–99)
Potassium: 4.2 mmol/L (ref 3.5–5.1)
Sodium: 141 mmol/L (ref 135–145)
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
## 2024-05-24 NOTE — Discharge Instructions (Signed)
 Your urine appears to be clean and without any signs of infection  We are always happy to see you back if you develop new signs or symptoms of infection. Otherwise follow closely with your primary care doctor

## 2024-09-01 ENCOUNTER — Emergency Department (HOSPITAL_BASED_OUTPATIENT_CLINIC_OR_DEPARTMENT_OTHER)
Admission: EM | Admit: 2024-09-01 | Discharge: 2024-09-01 | Disposition: A | Discharge status: Home / Self Care | Attending: Emergency Medicine | Admitting: Emergency Medicine

## 2024-09-01 ENCOUNTER — Other Ambulatory Visit: Payer: Self-pay

## 2024-09-01 ENCOUNTER — Encounter (HOSPITAL_BASED_OUTPATIENT_CLINIC_OR_DEPARTMENT_OTHER): Payer: Self-pay | Admitting: Urology

## 2024-09-01 DIAGNOSIS — N319 Neuromuscular dysfunction of bladder, unspecified: Secondary | ICD-10-CM | POA: Insufficient documentation

## 2024-09-01 DIAGNOSIS — N390 Urinary tract infection, site not specified: Secondary | ICD-10-CM | POA: Diagnosis not present

## 2024-09-01 DIAGNOSIS — Z960 Presence of urogenital implants: Secondary | ICD-10-CM | POA: Insufficient documentation

## 2024-09-01 DIAGNOSIS — Z87440 Personal history of urinary (tract) infections: Secondary | ICD-10-CM | POA: Insufficient documentation

## 2024-09-01 DIAGNOSIS — R32 Unspecified urinary incontinence: Secondary | ICD-10-CM | POA: Diagnosis present

## 2024-09-01 DIAGNOSIS — G8220 Paraplegia, unspecified: Secondary | ICD-10-CM | POA: Insufficient documentation

## 2024-09-01 LAB — URINALYSIS, ROUTINE W REFLEX MICROSCOPIC
Bilirubin Urine: NEGATIVE
Glucose, UA: NEGATIVE mg/dL
Hgb urine dipstick: NEGATIVE
Ketones, ur: NEGATIVE mg/dL
Leukocytes,Ua: NEGATIVE
Nitrite: NEGATIVE
Protein, ur: 100 mg/dL — AB
Specific Gravity, Urine: >=1.03 (ref 1.005–1.030)
pH: 6 (ref 5.0–8.0)

## 2024-09-01 LAB — CBC WITH DIFFERENTIAL/PLATELET
Abs Immature Granulocytes: 0.01 K/uL (ref 0.00–0.07)
Basophils Absolute: 0 K/uL (ref 0.0–0.1)
Basophils Relative: 1 %
Eosinophils Absolute: 0.1 K/uL (ref 0.0–0.5)
Eosinophils Relative: 2 %
HCT: 42.4 % (ref 39.0–52.0)
Hemoglobin: 14.9 g/dL (ref 13.0–17.0)
Immature Granulocytes: 0 %
Lymphocytes Relative: 38 %
Lymphs Abs: 1.3 K/uL (ref 0.7–4.0)
MCH: 27.2 pg (ref 26.0–34.0)
MCHC: 35.1 g/dL (ref 30.0–36.0)
MCV: 77.5 fL — ABNORMAL LOW (ref 80.0–100.0)
Monocytes Absolute: 0.2 K/uL (ref 0.1–1.0)
Monocytes Relative: 6 %
Neutro Abs: 1.8 K/uL (ref 1.7–7.7)
Neutrophils Relative %: 53 %
Platelets: 174 K/uL (ref 150–400)
RBC: 5.47 MIL/uL (ref 4.22–5.81)
RDW: 13.6 % (ref 11.5–15.5)
WBC: 3.4 K/uL — ABNORMAL LOW (ref 4.0–10.5)
nRBC: 0 % (ref 0.0–0.2)

## 2024-09-01 LAB — URINALYSIS, MICROSCOPIC (REFLEX)

## 2024-09-01 LAB — LACTIC ACID, PLASMA: Lactic Acid, Venous: 1.2 mmol/L (ref 0.5–1.9)

## 2024-09-01 LAB — BASIC METABOLIC PANEL WITH GFR
Anion gap: 8 (ref 5–15)
BUN: 15 mg/dL (ref 6–20)
CO2: 30 mmol/L (ref 22–32)
Calcium: 9.2 mg/dL (ref 8.9–10.3)
Chloride: 103 mmol/L (ref 98–111)
Creatinine, Ser: 1.06 mg/dL (ref 0.61–1.24)
GFR, Estimated: >60 mL/min (ref >60)
Glucose, Bld: 92 mg/dL (ref 70–99)
Potassium: 3.7 mmol/L (ref 3.5–5.1)
Sodium: 141 mmol/L (ref 135–145)
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
## 2024-09-01 NOTE — ED Triage Notes (Addendum)
 Pt states fever the last few days and states has been leaking urine between catheterization x 1 week , self cath x 25 years  States urine dark, no odor    Wheelchair mobility at baseline

## 2024-09-01 NOTE — Discharge Instructions (Signed)
 It was a pleasure taking care of you here today  Your urine did not show any evidence of infection however we did send for culture along with blood cultures.  Make sure to follow-up with your urology  If any of your urine or blood cultures results with infection you will be called  Return for any new or worsening symptoms

## 2024-09-01 NOTE — ED Notes (Signed)
 Pt alert and oriented X 4 at the time of discharge. RR even and unlabored. No acute distress noted. Pt verbalized understanding of discharge instructions as discussed. Pt in wheelchair to lobby at time of discharge.

## 2024-09-02 LAB — BLOOD CULTURE ID PANEL (REFLEXED) - BCID2

## 2024-09-03 ENCOUNTER — Telehealth (HOSPITAL_BASED_OUTPATIENT_CLINIC_OR_DEPARTMENT_OTHER): Payer: Self-pay

## 2024-09-03 LAB — URINE CULTURE: Culture: NO GROWTH

## 2024-09-04 LAB — CULTURE, BLOOD (ROUTINE X 2): Special Requests: ADEQUATE

## 2024-09-05 ENCOUNTER — Telehealth (HOSPITAL_BASED_OUTPATIENT_CLINIC_OR_DEPARTMENT_OTHER): Payer: Self-pay | Admitting: *Deleted

## 2024-09-05 NOTE — Telephone Encounter (Signed)
 Post ED Visit - Positive Culture Follow-up  Culture report reviewed by antimicrobial stewardship pharmacist: [facility] []  Rankin Dee, Pharm.D. []  Venetia Gully, Pharm.D., BCPS AQ-ID []  Garrel Crews, Pharm.D., BCPS []  Almarie Lunger, Pharm.D., BCPS []  [place], [address].D., BCPS, AAHIVP []  Rosaline Bihari, Pharm.D., BCPS, AAHIVP []  Vernell Meier, PharmD, BCPS []  Latanya Hint, PharmD, BCPS []  Donald Medley, PharmD, BCPS []  Rocky Bold, PharmD []  Dorothyann Alert, PharmD, BCPS [x]  Dorn Poot, PharmD  [facility] []  Rosaline Edison, PharmD []  Romona Bliss, PharmD []  Dolphus Roller, PharmD []  Veva Seip, Rph []  Vernell Daunt) Leonce, PharmD []  Eva Allis, PharmD []  Rosaline Millet, PharmD []  Iantha Batch, PharmD []  Arvin Gauss, PharmD []  Wanda Hasting, PharmD []  Ronal Rav, PharmD []  Rocky Slade, PharmD []  Bard Jeans, PharmD   Positive blood culture Addressed 10/3 by ED staff and MD.   Albino Alan Novak 09/05/2024, 10:18 AM

## 2024-09-06 LAB — CULTURE, BLOOD (ROUTINE X 2)
Culture: NO GROWTH
Special Requests: ADEQUATE
